# Patient Record
Sex: Male | Born: 1955
Health system: Southern US, Community
[De-identification: ages and names within clinical notes are randomized; demographics above are authoritative.]

## PROBLEM LIST (undated history)

## (undated) DIAGNOSIS — C801 Malignant (primary) neoplasm, unspecified: Secondary | ICD-10-CM

## (undated) DIAGNOSIS — K4090 Unilateral inguinal hernia, without obstruction or gangrene, not specified as recurrent: Secondary | ICD-10-CM

## (undated) DIAGNOSIS — G894 Chronic pain syndrome: Secondary | ICD-10-CM

## (undated) DIAGNOSIS — K279 Peptic ulcer, site unspecified, unspecified as acute or chronic, without hemorrhage or perforation: Secondary | ICD-10-CM

## (undated) DIAGNOSIS — K219 Gastro-esophageal reflux disease without esophagitis: Secondary | ICD-10-CM

## (undated) DIAGNOSIS — M1990 Unspecified osteoarthritis, unspecified site: Secondary | ICD-10-CM

## (undated) DIAGNOSIS — N400 Enlarged prostate without lower urinary tract symptoms: Secondary | ICD-10-CM

## (undated) DIAGNOSIS — K5790 Diverticulosis of intestine, part unspecified, without perforation or abscess without bleeding: Secondary | ICD-10-CM

## (undated) DIAGNOSIS — M4802 Spinal stenosis, cervical region: Secondary | ICD-10-CM

## (undated) DIAGNOSIS — E785 Hyperlipidemia, unspecified: Secondary | ICD-10-CM

## (undated) HISTORY — PX: ELBOW SURGERY: SHX618

## (undated) HISTORY — DX: Unilateral inguinal hernia, without obstruction or gangrene, not specified as recurrent: K40.90

## (undated) HISTORY — DX: Chronic pain syndrome: G89.4

## (undated) HISTORY — DX: Unspecified osteoarthritis, unspecified site: M19.90

## (undated) HISTORY — DX: Peptic ulcer, site unspecified, unspecified as acute or chronic, without hemorrhage or perforation: K27.9

## (undated) HISTORY — PX: FOOT SURGERY: SHX648

## (undated) HISTORY — PX: HERNIA REPAIR: SHX51

## (undated) HISTORY — DX: Diverticulosis of intestine, part unspecified, without perforation or abscess without bleeding: K57.90

## (undated) HISTORY — PX: HAND SURGERY: SHX662

## (undated) HISTORY — DX: Benign prostatic hyperplasia without lower urinary tract symptoms: N40.0

## (undated) HISTORY — PX: SHOULDER ARTHROSCOPY: SHX128

## (undated) HISTORY — DX: Spinal stenosis, cervical region: M48.02

## (undated) HISTORY — PX: KNEE SURGERY: SHX244

## (undated) HISTORY — PX: KNEE ARTHROSCOPY: SUR90

## (undated) HISTORY — PX: ESOPHAGOGASTRODUODENOSCOPY: SHX1529

## (undated) HISTORY — PX: ROTATOR CUFF REPAIR: SHX139

## (undated) HISTORY — PX: VASECTOMY: SHX75

---

## 1998-04-23 ENCOUNTER — Ambulatory Visit (HOSPITAL_BASED_OUTPATIENT_CLINIC_OR_DEPARTMENT_OTHER): Admission: RE | Admit: 1998-04-23 | Discharge: 1998-04-23 | Payer: Self-pay | Admitting: Surgery

## 2001-05-03 ENCOUNTER — Encounter (INDEPENDENT_AMBULATORY_CARE_PROVIDER_SITE_OTHER): Payer: Self-pay | Admitting: Specialist

## 2001-05-03 ENCOUNTER — Other Ambulatory Visit: Admission: RE | Admit: 2001-05-03 | Discharge: 2001-05-03 | Payer: Self-pay | Admitting: Urology

## 2005-12-13 ENCOUNTER — Encounter: Admission: RE | Admit: 2005-12-13 | Discharge: 2005-12-13 | Payer: Self-pay | Admitting: Orthopedic Surgery

## 2008-12-19 ENCOUNTER — Ambulatory Visit: Payer: Self-pay | Admitting: Gastroenterology

## 2009-01-01 ENCOUNTER — Ambulatory Visit: Payer: Self-pay | Admitting: Gastroenterology

## 2012-03-05 ENCOUNTER — Other Ambulatory Visit: Payer: Self-pay | Admitting: Gastroenterology

## 2012-03-05 DIAGNOSIS — R109 Unspecified abdominal pain: Secondary | ICD-10-CM

## 2012-03-05 DIAGNOSIS — R634 Abnormal weight loss: Secondary | ICD-10-CM

## 2012-03-09 ENCOUNTER — Ambulatory Visit
Admission: RE | Admit: 2012-03-09 | Discharge: 2012-03-09 | Disposition: A | Discharge status: Home / Self Care | Payer: BC Managed Care – PPO | Source: Ambulatory Visit | Attending: Gastroenterology | Admitting: Gastroenterology

## 2012-03-09 DIAGNOSIS — R634 Abnormal weight loss: Secondary | ICD-10-CM

## 2012-03-09 DIAGNOSIS — R109 Unspecified abdominal pain: Secondary | ICD-10-CM

## 2012-03-09 MED ORDER — IOHEXOL 300 MG/ML  SOLN
125.0000 mL | Freq: Once | INTRAMUSCULAR | Status: AC | PRN
  Administered 2012-03-09: 125 mL via INTRAVENOUS

## 2013-12-23 ENCOUNTER — Other Ambulatory Visit (HOSPITAL_BASED_OUTPATIENT_CLINIC_OR_DEPARTMENT_OTHER): Payer: Self-pay | Admitting: Family Medicine

## 2013-12-23 DIAGNOSIS — M542 Cervicalgia: Secondary | ICD-10-CM

## 2013-12-27 ENCOUNTER — Ambulatory Visit (HOSPITAL_BASED_OUTPATIENT_CLINIC_OR_DEPARTMENT_OTHER)
Admission: RE | Admit: 2013-12-27 | Discharge: 2013-12-27 | Disposition: A | Discharge status: Home / Self Care | Payer: BC Managed Care – PPO | Source: Ambulatory Visit | Attending: Family Medicine | Admitting: Family Medicine

## 2013-12-27 DIAGNOSIS — M542 Cervicalgia: Secondary | ICD-10-CM

## 2013-12-27 DIAGNOSIS — M47812 Spondylosis without myelopathy or radiculopathy, cervical region: Secondary | ICD-10-CM | POA: Insufficient documentation

## 2013-12-27 DIAGNOSIS — M4802 Spinal stenosis, cervical region: Secondary | ICD-10-CM | POA: Insufficient documentation

## 2014-03-20 ENCOUNTER — Encounter: Payer: Self-pay | Admitting: Gastroenterology

## 2015-09-06 ENCOUNTER — Other Ambulatory Visit (HOSPITAL_BASED_OUTPATIENT_CLINIC_OR_DEPARTMENT_OTHER): Payer: Self-pay | Admitting: Family Medicine

## 2015-09-06 DIAGNOSIS — R0989 Other specified symptoms and signs involving the circulatory and respiratory systems: Secondary | ICD-10-CM

## 2015-09-07 ENCOUNTER — Ambulatory Visit (HOSPITAL_BASED_OUTPATIENT_CLINIC_OR_DEPARTMENT_OTHER)
Admission: RE | Admit: 2015-09-07 | Discharge: 2015-09-07 | Disposition: A | Discharge status: Home / Self Care | Payer: BC Managed Care – PPO | Source: Ambulatory Visit | Attending: Family Medicine | Admitting: Family Medicine

## 2015-09-07 DIAGNOSIS — R0989 Other specified symptoms and signs involving the circulatory and respiratory systems: Secondary | ICD-10-CM

## 2015-09-10 ENCOUNTER — Other Ambulatory Visit (HOSPITAL_BASED_OUTPATIENT_CLINIC_OR_DEPARTMENT_OTHER): Payer: BC Managed Care – PPO

## 2018-05-31 ENCOUNTER — Other Ambulatory Visit: Payer: Self-pay | Admitting: Internal Medicine

## 2018-05-31 DIAGNOSIS — M4802 Spinal stenosis, cervical region: Secondary | ICD-10-CM

## 2018-06-21 ENCOUNTER — Ambulatory Visit
Admission: RE | Admit: 2018-06-21 | Discharge: 2018-06-21 | Disposition: A | Discharge status: Home / Self Care | Payer: BC Managed Care – PPO | Source: Ambulatory Visit | Attending: Internal Medicine | Admitting: Internal Medicine

## 2018-06-21 DIAGNOSIS — M4802 Spinal stenosis, cervical region: Secondary | ICD-10-CM

## 2018-12-20 ENCOUNTER — Encounter: Payer: Self-pay | Admitting: Gastroenterology

## 2019-09-28 ENCOUNTER — Encounter (HOSPITAL_BASED_OUTPATIENT_CLINIC_OR_DEPARTMENT_OTHER): Payer: Self-pay | Admitting: Orthopedic Surgery

## 2019-09-28 ENCOUNTER — Other Ambulatory Visit: Payer: Self-pay | Admitting: Orthopedic Surgery

## 2019-09-28 ENCOUNTER — Other Ambulatory Visit: Payer: Self-pay

## 2019-09-29 ENCOUNTER — Other Ambulatory Visit (HOSPITAL_COMMUNITY): Admission: RE | Admit: 2019-09-29 | Payer: BC Managed Care – PPO | Source: Ambulatory Visit

## 2019-09-30 ENCOUNTER — Other Ambulatory Visit (HOSPITAL_COMMUNITY)
Admission: RE | Admit: 2019-09-30 | Discharge: 2019-09-30 | Disposition: A | Discharge status: Home / Self Care | Payer: BC Managed Care – PPO | Source: Ambulatory Visit | Attending: Orthopedic Surgery | Admitting: Orthopedic Surgery

## 2019-09-30 DIAGNOSIS — Z20822 Contact with and (suspected) exposure to covid-19: Secondary | ICD-10-CM | POA: Diagnosis not present

## 2019-09-30 DIAGNOSIS — Z01812 Encounter for preprocedural laboratory examination: Secondary | ICD-10-CM | POA: Diagnosis present

## 2019-09-30 LAB — SARS CORONAVIRUS 2 (TAT 6-24 HRS): SARS Coronavirus 2: NEGATIVE

## 2019-10-03 ENCOUNTER — Encounter (HOSPITAL_BASED_OUTPATIENT_CLINIC_OR_DEPARTMENT_OTHER): Payer: Self-pay | Admitting: Orthopedic Surgery

## 2019-10-03 ENCOUNTER — Ambulatory Visit (HOSPITAL_BASED_OUTPATIENT_CLINIC_OR_DEPARTMENT_OTHER): Payer: BC Managed Care – PPO | Admitting: Anesthesiology

## 2019-10-03 ENCOUNTER — Ambulatory Visit (HOSPITAL_BASED_OUTPATIENT_CLINIC_OR_DEPARTMENT_OTHER)
Admission: RE | Admit: 2019-10-03 | Discharge: 2019-10-03 | Disposition: A | Discharge status: Home / Self Care | Payer: BC Managed Care – PPO | Attending: Orthopedic Surgery | Admitting: Orthopedic Surgery

## 2019-10-03 ENCOUNTER — Encounter (HOSPITAL_BASED_OUTPATIENT_CLINIC_OR_DEPARTMENT_OTHER)
Admission: RE | Disposition: A | Discharge status: Home / Self Care | Payer: Self-pay | Source: Home / Self Care | Attending: Orthopedic Surgery

## 2019-10-03 ENCOUNTER — Other Ambulatory Visit: Payer: Self-pay

## 2019-10-03 DIAGNOSIS — Z79899 Other long term (current) drug therapy: Secondary | ICD-10-CM | POA: Insufficient documentation

## 2019-10-03 DIAGNOSIS — E669 Obesity, unspecified: Secondary | ICD-10-CM | POA: Insufficient documentation

## 2019-10-03 DIAGNOSIS — M67441 Ganglion, right hand: Secondary | ICD-10-CM | POA: Insufficient documentation

## 2019-10-03 DIAGNOSIS — E785 Hyperlipidemia, unspecified: Secondary | ICD-10-CM | POA: Insufficient documentation

## 2019-10-03 DIAGNOSIS — M19041 Primary osteoarthritis, right hand: Secondary | ICD-10-CM | POA: Diagnosis not present

## 2019-10-03 DIAGNOSIS — Z6831 Body mass index (BMI) 31.0-31.9, adult: Secondary | ICD-10-CM | POA: Insufficient documentation

## 2019-10-03 HISTORY — PX: CYST EXCISION: SHX5701

## 2019-10-03 HISTORY — DX: Gastro-esophageal reflux disease without esophagitis: K21.9

## 2019-10-03 HISTORY — DX: Hyperlipidemia, unspecified: E78.5

## 2019-10-03 SURGERY — CYST REMOVAL
Anesthesia: Monitor Anesthesia Care | Site: Thumb | Laterality: Right

## 2019-10-03 MED ORDER — CHLORHEXIDINE GLUCONATE 4 % EX LIQD: 60.0000 mL | Freq: Once | CUTANEOUS | Status: DC

## 2019-10-03 MED ORDER — ONDANSETRON HCL 4 MG/2ML IJ SOLN: 4.0000 mg | Freq: Once | INTRAMUSCULAR | Status: DC | PRN

## 2019-10-03 MED ORDER — LIDOCAINE HCL (CARDIAC) PF 100 MG/5ML IV SOSY
PREFILLED_SYRINGE | INTRAVENOUS | Status: DC | PRN
  Administered 2019-10-03: 60 mg via INTRAVENOUS

## 2019-10-03 MED ORDER — OXYCODONE HCL 5 MG PO TABS
ORAL_TABLET | ORAL | Status: AC
  Filled 2019-10-03: qty 1

## 2019-10-03 MED ORDER — FENTANYL CITRATE (PF) 100 MCG/2ML IJ SOLN: 50.0000 ug | INTRAMUSCULAR | Status: DC | PRN

## 2019-10-03 MED ORDER — LIDOCAINE 2% (20 MG/ML) 5 ML SYRINGE
INTRAMUSCULAR | Status: AC
  Filled 2019-10-03: qty 5

## 2019-10-03 MED ORDER — ONDANSETRON HCL 4 MG/2ML IJ SOLN
INTRAMUSCULAR | Status: DC | PRN
  Administered 2019-10-03: 4 mg via INTRAVENOUS

## 2019-10-03 MED ORDER — MIDAZOLAM HCL 2 MG/2ML IJ SOLN: 1.0000 mg | INTRAMUSCULAR | Status: DC | PRN

## 2019-10-03 MED ORDER — OXYCODONE HCL 5 MG/5ML PO SOLN: 5.0000 mg | Freq: Once | ORAL | Status: AC | PRN

## 2019-10-03 MED ORDER — BUPIVACAINE HCL (PF) 0.25 % IJ SOLN
INTRAMUSCULAR | Status: DC | PRN
  Administered 2019-10-03: 9 mL

## 2019-10-03 MED ORDER — HYDROCODONE-ACETAMINOPHEN 5-325 MG PO TABS: ORAL_TABLET | ORAL | 0 refills | Status: DC

## 2019-10-03 MED ORDER — OXYCODONE HCL 5 MG PO TABS
5.0000 mg | ORAL_TABLET | Freq: Once | ORAL | Status: AC | PRN
  Administered 2019-10-03: 5 mg via ORAL

## 2019-10-03 MED ORDER — FENTANYL CITRATE (PF) 100 MCG/2ML IJ SOLN
INTRAMUSCULAR | Status: DC | PRN
  Administered 2019-10-03: 100 ug via INTRAVENOUS

## 2019-10-03 MED ORDER — LACTATED RINGERS IV SOLN: INTRAVENOUS | Status: DC

## 2019-10-03 MED ORDER — PROPOFOL 500 MG/50ML IV EMUL
INTRAVENOUS | Status: DC | PRN
  Administered 2019-10-03: 25 ug/kg/min via INTRAVENOUS

## 2019-10-03 MED ORDER — MIDAZOLAM HCL 5 MG/5ML IJ SOLN
INTRAMUSCULAR | Status: DC | PRN
  Administered 2019-10-03: 2 mg via INTRAVENOUS

## 2019-10-03 MED ORDER — VANCOMYCIN HCL IN DEXTROSE 1-5 GM/200ML-% IV SOLN
1000.0000 mg | INTRAVENOUS | Status: AC
  Administered 2019-10-03: 1000 mg via INTRAVENOUS

## 2019-10-03 MED ORDER — FENTANYL CITRATE (PF) 100 MCG/2ML IJ SOLN
INTRAMUSCULAR | Status: AC
  Filled 2019-10-03: qty 2

## 2019-10-03 MED ORDER — ONDANSETRON HCL 4 MG/2ML IJ SOLN
INTRAMUSCULAR | Status: AC
  Filled 2019-10-03: qty 2

## 2019-10-03 MED ORDER — VANCOMYCIN HCL IN DEXTROSE 1-5 GM/200ML-% IV SOLN
INTRAVENOUS | Status: AC
  Filled 2019-10-03: qty 200

## 2019-10-03 MED ORDER — FENTANYL CITRATE (PF) 100 MCG/2ML IJ SOLN: 25.0000 ug | INTRAMUSCULAR | Status: DC | PRN

## 2019-10-03 MED ORDER — FENTANYL CITRATE (PF) 100 MCG/2ML IJ SOLN
25.0000 ug | INTRAMUSCULAR | Status: DC | PRN
  Administered 2019-10-03: 50 ug via INTRAVENOUS

## 2019-10-03 MED ORDER — MIDAZOLAM HCL 2 MG/2ML IJ SOLN
INTRAMUSCULAR | Status: AC
  Filled 2019-10-03: qty 2

## 2019-10-03 MED ORDER — PROPOFOL 10 MG/ML IV BOLUS
INTRAVENOUS | Status: AC
  Filled 2019-10-03: qty 20

## 2019-10-03 MED ORDER — VANCOMYCIN HCL 1000 MG IV SOLR
INTRAVENOUS | Status: DC | PRN
  Administered 2019-10-03: 1000 mg via INTRAVENOUS

## 2019-10-03 SURGICAL SUPPLY — 52 items
BENZOIN TINCTURE PRP APPL 2/3 (GAUZE/BANDAGES/DRESSINGS) IMPLANT
BLADE MINI RND TIP GREEN BEAV (BLADE) ×3 IMPLANT
BLADE SURG 15 STRL LF DISP TIS (BLADE) ×2 IMPLANT
BLADE SURG 15 STRL SS (BLADE) ×6
BNDG COHESIVE 1X5 TAN STRL LF (GAUZE/BANDAGES/DRESSINGS) ×3 IMPLANT
BNDG COHESIVE 2X5 TAN STRL LF (GAUZE/BANDAGES/DRESSINGS) IMPLANT
BNDG CONFORM 2 STRL LF (GAUZE/BANDAGES/DRESSINGS) IMPLANT
BNDG ELASTIC 2X5.8 VLCR STR LF (GAUZE/BANDAGES/DRESSINGS) IMPLANT
BNDG ELASTIC 3X5.8 VLCR STR LF (GAUZE/BANDAGES/DRESSINGS) IMPLANT
BNDG ESMARK 4X9 LF (GAUZE/BANDAGES/DRESSINGS) ×3 IMPLANT
BNDG GAUZE 1X2.1 STRL (MISCELLANEOUS) IMPLANT
BNDG GAUZE ELAST 4 BULKY (GAUZE/BANDAGES/DRESSINGS) IMPLANT
BNDG PLASTER X FAST 3X3 WHT LF (CAST SUPPLIES) IMPLANT
CHLORAPREP W/TINT 26 (MISCELLANEOUS) ×3 IMPLANT
CLOSURE WOUND 1/2 X4 (GAUZE/BANDAGES/DRESSINGS)
CORD BIPOLAR FORCEPS 12FT (ELECTRODE) ×3 IMPLANT
COVER BACK TABLE 60X90IN (DRAPES) ×3 IMPLANT
COVER MAYO STAND STRL (DRAPES) ×3 IMPLANT
COVER WAND RF STERILE (DRAPES) IMPLANT
CUFF TOURN SGL QUICK 18X4 (TOURNIQUET CUFF) ×3 IMPLANT
DRAPE EXTREMITY T 121X128X90 (DISPOSABLE) ×3 IMPLANT
DRAPE SURG 17X23 STRL (DRAPES) ×3 IMPLANT
GAUZE SPONGE 4X4 12PLY STRL (GAUZE/BANDAGES/DRESSINGS) ×3 IMPLANT
GAUZE XEROFORM 1X8 LF (GAUZE/BANDAGES/DRESSINGS) ×3 IMPLANT
GLOVE BIO SURGEON STRL SZ7 (GLOVE) ×3 IMPLANT
GLOVE BIO SURGEON STRL SZ7.5 (GLOVE) ×3 IMPLANT
GLOVE BIOGEL PI IND STRL 7.5 (GLOVE) ×1 IMPLANT
GLOVE BIOGEL PI IND STRL 8 (GLOVE) ×1 IMPLANT
GLOVE BIOGEL PI INDICATOR 7.5 (GLOVE) ×2
GLOVE BIOGEL PI INDICATOR 8 (GLOVE) ×2
GOWN STRL REUS W/ TWL LRG LVL3 (GOWN DISPOSABLE) ×1 IMPLANT
GOWN STRL REUS W/ TWL XL LVL3 (GOWN DISPOSABLE) ×1 IMPLANT
GOWN STRL REUS W/TWL LRG LVL3 (GOWN DISPOSABLE) ×3
GOWN STRL REUS W/TWL XL LVL3 (GOWN DISPOSABLE) ×3
NEEDLE HYPO 25X1 1.5 SAFETY (NEEDLE) ×3 IMPLANT
NS IRRIG 1000ML POUR BTL (IV SOLUTION) ×3 IMPLANT
PACK BASIN DAY SURGERY FS (CUSTOM PROCEDURE TRAY) ×3 IMPLANT
PAD CAST 3X4 CTTN HI CHSV (CAST SUPPLIES) IMPLANT
PAD CAST 4YDX4 CTTN HI CHSV (CAST SUPPLIES) IMPLANT
PADDING CAST ABS 4INX4YD NS (CAST SUPPLIES)
PADDING CAST ABS COTTON 4X4 ST (CAST SUPPLIES) IMPLANT
PADDING CAST COTTON 3X4 STRL (CAST SUPPLIES)
PADDING CAST COTTON 4X4 STRL (CAST SUPPLIES)
SPLINT FINGER 3.25 BULB 911905 (SOFTGOODS) ×3 IMPLANT
STOCKINETTE 4X48 STRL (DRAPES) ×3 IMPLANT
STRIP CLOSURE SKIN 1/2X4 (GAUZE/BANDAGES/DRESSINGS) IMPLANT
SUT ETHILON 3 0 PS 1 (SUTURE) IMPLANT
SUT ETHILON 4 0 PS 2 18 (SUTURE) ×3 IMPLANT
SYR BULB 3OZ (MISCELLANEOUS) ×3 IMPLANT
SYR CONTROL 10ML LL (SYRINGE) ×3 IMPLANT
TOWEL GREEN STERILE FF (TOWEL DISPOSABLE) ×6 IMPLANT
UNDERPAD 30X36 HEAVY ABSORB (UNDERPADS AND DIAPERS) ×3 IMPLANT

## 2019-10-03 NOTE — Anesthesia Postprocedure Evaluation (Signed)
Anesthesia Post Note  Patient: Stephen Carey  Procedure(s) Performed: EXCISION MUCOID CYST WITH INTERPHALAGEAL JOINT ARTHROTOMY RIGHT THUMB (Right Thumb)     Patient location during evaluation: PACU Anesthesia Type: MAC Level of consciousness: awake and alert Pain management: pain level controlled Vital Signs Assessment: post-procedure vital signs reviewed and stable Respiratory status: spontaneous breathing, nonlabored ventilation and respiratory function stable Cardiovascular status: stable and blood pressure returned to baseline Anesthetic complications: no    Last Vitals:  Vitals:   10/03/19 1418 10/03/19 1430  BP: 139/83 (!) 160/86  Pulse: 63 61  Resp: 15 16  Temp:    SpO2: 100% 100%    Last Pain:  Vitals:   10/03/19 1410  TempSrc:   PainSc: [place] [name]

## 2019-10-03 NOTE — Transfer of Care (Signed)
Immediate Anesthesia Transfer of Care Note  Patient: Stephen Carey  Procedure(s) Performed: EXCISION MUCOID CYST WITH INTERPHALAGEAL JOINT ARTHROTOMY RIGHT THUMB (Right Thumb)  Patient Location: PACU  Anesthesia Type:MAC and Bier block  Level of Consciousness: awake and alert   Airway & Oxygen Therapy: Patient Spontanous Breathing and Patient connected to face mask oxygen  Post-op Assessment: Report given to RN and Post -op Vital signs reviewed and stable  Post vital signs: Reviewed and stable  Last Vitals:  Vitals Value Taken Time  BP    Temp    Pulse 67 10/03/19 1410  Resp 18 10/03/19 1411  SpO2 100 % 10/03/19 1410  Vitals shown include unvalidated device data.  Last Pain:  Vitals:   10/03/19 1213  TempSrc: Tympanic  PainSc: 0-No pain         Complications: No apparent anesthesia complications

## 2019-10-03 NOTE — Op Note (Signed)
NAME: Stephen Carey MEDICAL RECORD NO: [id number] DATE OF BIRTH: Jan 05, 1956 FACILITY: [facility] LOCATION: [facility] PHYSICIAN: Tennis Must, MD   OPERATIVE REPORT   DATE OF PROCEDURE: 10/03/19    PREOPERATIVE DIAGNOSIS:   Right thumb mucoid cyst and IP joint arthritis   POSTOPERATIVE DIAGNOSIS:   Right thumb mucoid cyst and IP joint arthritis   PROCEDURE:   1.  Right thumb excision mucoid cyst 2.  Right thumb removal of bony osteophytes from proximal phalanx and debridement of IP joint   SURGEON:  Leanora Cover, M.D.   ASSISTANT: none   ANESTHESIA:  Bier block with sedation   INTRAVENOUS FLUIDS:  Per anesthesia flow sheet.   ESTIMATED BLOOD LOSS:  Minimal.   COMPLICATIONS:  None.   SPECIMENS:   Mucoid cyst to pathology   TOURNIQUET TIME:    Total Tourniquet Time Documented: Forearm (Right) - 33 minutes Total: Forearm (Right) - 33 minutes    DISPOSITION:  Stable to PACU.   INDICATIONS: 64 year old male with mass on right thumb over the past 6 months.  Is bothersome to him.  Wishes to have it removed and the IP joint debrided to try to prevent recurrence. Risks, benefits and alternatives of surgery were discussed including the risks of blood loss, infection, damage to nerves, vessels, tendons, ligaments, bone for surgery, need for additional surgery, complications with wound healing, continued pain, stiffness, recurrence.  He voiced understanding of these risks and elected to proceed.  OPERATIVE COURSE:  After being identified preoperatively by myself,  the patient and I agreed on the procedure and site of the procedure.  The surgical site was marked.  Surgical consent had been signed. He was given IV antibiotics as preoperative antibiotic prophylaxis. He was transferred to the operating room and placed on the operating table in supine position with the Right upper extremity on an arm board.  Bier block anesthesia was induced by the anesthesiologist.  Right  upper extremity was prepped and draped in normal sterile orthopedic fashion.  A surgical pause was performed between the surgeons, anesthesia, and operating room staff and all were in agreement as to the patient, procedure, and site of procedure.  Tourniquet at the proximal aspect of the forearm had been inflated for the Bier block.    A hockey-stick shaped incision was made at the IP joint of the right thumb.  This is carried in subcutaneous tissues by spreading technique.  The mass was easily identified.  It was carefully freed up from surrounding tissues.  There was a stalk coming from the IP joint underneath the extensor tendon.  There was more of the cystic mass underneath the extensor tendon.  There was what appeared to be a cutaneous nerve branch coursing through the mass.  Attempts to preserve this were not successful.  The mass was removed and sent to pathology for examination.  The IP joint was entered underneath the extensor tendon.  There was a large osteophyte on the dorsum of the proximal phalanx.  This was removed with the rongeurs.  The synovium and joint were debrided with the rongeurs.  The joint and wound were then copiously irrigated with sterile saline.  The wound was closed with 4-0 nylon in a horizontal mattress fashion.  A digital block was performed with quarter percent plain Marcaine to aid in postoperative analgesia.  Wound was dressed with sterile Xeroform 4 x 4 and wrapped with a Coban dressing lightly.  An AlumaFoam splint was placed and wrapped lightly  with Coban dressing.  The tourniquet was deflated at 33 minutes.  Fingertips were pink with brisk capillary refill after deflation of tourniquet.  The operative  drapes were broken down.  The patient was awoken from anesthesia safely.  He was transferred back to the stretcher and taken to PACU in stable condition.  I will see him back in the office in 1 week for postoperative followup.  I will give him a prescription for Norco 5/325 1-2  tabs PO q6 hours prn pain, dispense # 20.   Leanora Cover, MD Electronically signed, 10/03/19

## 2019-10-03 NOTE — H&P (Signed)
Stephen Carey is an 64 y.o. male.   Chief Complaint: right thumb cyst HPI: 64 yo male with mass right thumb x 6 months.  It is bothersome to him and he wishes to have it removed.  Allergies:  Allergies  Allergen Reactions  . Doxycycline     REACTION: severe diarrhea  . Penicillins     REACTION: as child  . Sulfonamide Derivatives     REACTION: rash, nausea    Past Medical History:  Diagnosis Date  . GERD (gastroesophageal reflux disease)   . Hyperlipidemia     Past Surgical History:  Procedure Laterality Date  . ELBOW SURGERY Bilateral   . FOOT SURGERY Right   . HAND SURGERY Left   . HERNIA REPAIR Bilateral   . KNEE ARTHROSCOPY Right   . SHOULDER ARTHROSCOPY Left     Family History: History reviewed. No pertinent family history.  Social History:   reports that he has never smoked. He has never used smokeless tobacco. He reports that he does not drink alcohol or use drugs.  Medications: Medications Prior to Admission  Medication Sig Dispense Refill  . atorvastatin (LIPITOR) 40 MG tablet Take 40 mg by mouth daily.    . Fluocinolone Acetonide (DERMOTIC) 0.01 % OIL Place in ear(s).    . loratadine (CLARITIN) 10 MG tablet Take 10 mg by mouth daily.    . traMADol (ULTRAM) 50 MG tablet Take by mouth every 6 (six) hours as needed.      No results found for this or any previous visit (from the past 48 hour(s)).  No results found.   A comprehensive review of systems was negative.  Blood pressure 131/78, pulse 69, temperature (!) 97.4 F (36.3 C), temperature source Tympanic, resp. rate 18, height 6' (1.829 m), weight 106.6 kg, SpO2 100 %.  General appearance: alert, cooperative and appears stated age Head: Normocephalic, without obvious abnormality, atraumatic Neck: supple, symmetrical, trachea midline Cardio: regular rate and rhythm Resp: clear to auscultation bilaterally Extremities: Intact sensation and capillary refill all digits.  +epl/fpl/io.  No wounds.   Pulses: 2+ and symmetric Skin: Skin color, texture, turgor normal. No rashes or lesions Neurologic: Grossly normal Incision/Wound: none  Assessment/Plan Right thumb mucoid cyst and dip joint arthritis.  Non operative and operative treatment options have been discussed with the patient and patient wishes to proceed with operative treatment. Risks, benefits, and alternatives of surgery have been discussed and the patient agrees with the plan of care.   Leanora Cover 10/03/2019, 12:35 PM

## 2019-10-03 NOTE — Anesthesia Procedure Notes (Signed)
Anesthesia Regional Block: Bier block (IV Regional)   Pre-Anesthetic Checklist: ,, timeout performed, Correct Patient, Correct Site, Correct Laterality, Correct Procedure,, site marked, surgical consent,, at surgeon's request  Laterality: Right     Needles:  Injection technique: Single-shot  Needle Type: Other      Needle Gauge: 20     Additional Needles:   Procedures:,,,,, intact distal pulses, Esmarch exsanguination, single tourniquet utilized,  Narrative:  Start time: 10/03/2019 1:29 PM End time: 10/03/2019 1:30 PM Injection made incrementally with aspirations every 33 mL.  Performed by: Personally

## 2019-10-03 NOTE — Anesthesia Procedure Notes (Signed)
Performed by: Chou Busler W, CRNA       

## 2019-10-03 NOTE — Discharge Instructions (Addendum)
[facility] Instructions Hand Surgery  Wound Care: Keep your hand elevated above the level of your heart.  Do not allow it to dangle by your side.  Keep the dressing dry and do not remove it unless your doctor advises you to do so.  He will usually change it at the time of your post-op visit.  Moving your fingers is advised to stimulate circulation but will depend on the site of your surgery.  If you have a splint applied, your doctor will advise you regarding movement.  Activity: Do not drive or operate machinery today.  Rest today and then you may return to your normal activity and work as indicated by your physician.  Diet:  Drink liquids today or eat a light diet.  You may resume a regular diet tomorrow.    General expectations: Pain for two to three days. *You had 5 mg of Oxycodone at 2:45 PM Fingers may become slightly swollen.  Call your doctor if any of the following occur: Severe pain not relieved by pain medication. Elevated temperature. Dressing soaked with blood. Inability to move fingers. White or bluish color to fingers.   Post Anesthesia Home Care Instructions  Activity: Get plenty of rest for the remainder of the day. A responsible individual must stay with you for 24 hours following the procedure.  For the next 24 hours, DO NOT: -Drive a car -Paediatric nurse -Drink alcoholic beverages -Take any medication unless instructed by your physician -Make any legal decisions or sign important papers.  Meals: Start with liquid foods such as gelatin or soup. Progress to regular foods as tolerated. Avoid greasy, spicy, heavy foods. If nausea and/or vomiting occur, drink only clear liquids until the nausea and/or vomiting subsides. Call your physician if vomiting continues.  Special Instructions/Symptoms: Your throat may feel dry or sore from the anesthesia or the breathing tube placed in your throat during surgery. If this causes discomfort, gargle with warm salt water.  The discomfort should disappear within 24 hours.  If you had a scopolamine patch placed behind your ear for the management of post- operative nausea and/or vomiting:  1. The medication in the patch is effective for 72 hours, after which it should be removed.  Wrap patch in a tissue and discard in the trash. Wash hands thoroughly with soap and water. 2. You may remove the patch earlier than 72 hours if you experience unpleasant side effects which may include dry mouth, dizziness or visual disturbances. 3. Avoid touching the patch. Wash your hands with soap and water after contact with the patch.

## 2019-10-03 NOTE — Anesthesia Preprocedure Evaluation (Addendum)
Anesthesia Evaluation  Patient identified by MRN, date of birth, ID band Patient awake    Reviewed: Allergy & Precautions, Patient's Chart, lab work & pertinent test results  History of Anesthesia Complications Negative for: history of anesthetic complications  Airway Mallampati: II  TM Distance: >3 FB     Dental   Pulmonary neg pulmonary ROS,    breath sounds clear to auscultation       Cardiovascular negative cardio ROS   Rhythm:Regular Rate:Normal     Neuro/Psych negative neurological ROS  negative psych ROS   GI/Hepatic Neg liver ROS, GERD  ,  Endo/Other   Obesity   Renal/GU negative Renal ROS     Musculoskeletal negative musculoskeletal ROS (+)   Abdominal   Peds  Hematology negative hematology ROS (+)   Anesthesia Other Findings Covid neg 09/30/19   Reproductive/Obstetrics                            Anesthesia Physical Anesthesia Plan  ASA: III  Anesthesia Plan: MAC and Bier Block and Bier Block-LIDOCAINE ONLY   Post-op Pain Management:    Induction: Intravenous  PONV Risk Score and Plan: 1 and Propofol infusion and Treatment may vary due to age or medical condition  Airway Management Planned: Simple Face Mask and Nasal Cannula  Additional Equipment: None  Intra-op Plan:   Post-operative Plan:   Informed Consent: I have reviewed the patients History and Physical, chart, labs and discussed the procedure including the risks, benefits and alternatives for the proposed anesthesia with the patient or authorized representative who has indicated his/her understanding and acceptance.     Dental advisory given  Plan Discussed with: CRNA and Anesthesiologist  Anesthesia Plan Comments:        Anesthesia Quick Evaluation

## 2019-10-04 ENCOUNTER — Encounter: Payer: Self-pay | Admitting: *Deleted

## 2019-10-04 LAB — SURGICAL PATHOLOGY

## 2020-09-21 ENCOUNTER — Other Ambulatory Visit: Payer: Self-pay | Admitting: Internal Medicine

## 2020-09-21 DIAGNOSIS — R29898 Other symptoms and signs involving the musculoskeletal system: Secondary | ICD-10-CM

## 2020-10-01 DIAGNOSIS — Z961 Presence of intraocular lens: Secondary | ICD-10-CM | POA: Diagnosis not present

## 2020-10-01 DIAGNOSIS — H25042 Posterior subcapsular polar age-related cataract, left eye: Secondary | ICD-10-CM | POA: Diagnosis not present

## 2020-10-01 DIAGNOSIS — H524 Presbyopia: Secondary | ICD-10-CM | POA: Diagnosis not present

## 2020-10-01 DIAGNOSIS — H2512 Age-related nuclear cataract, left eye: Secondary | ICD-10-CM | POA: Diagnosis not present

## 2020-10-08 ENCOUNTER — Other Ambulatory Visit: Payer: BC Managed Care – PPO

## 2020-10-08 ENCOUNTER — Other Ambulatory Visit: Payer: Self-pay

## 2020-10-08 ENCOUNTER — Ambulatory Visit
Admission: RE | Admit: 2020-10-08 | Discharge: 2020-10-08 | Disposition: A | Discharge status: Home / Self Care | Payer: Medicare HMO | Source: Ambulatory Visit | Attending: Internal Medicine | Admitting: Internal Medicine

## 2020-10-08 DIAGNOSIS — Z5181 Encounter for therapeutic drug level monitoring: Secondary | ICD-10-CM | POA: Diagnosis not present

## 2020-10-08 DIAGNOSIS — M48061 Spinal stenosis, lumbar region without neurogenic claudication: Secondary | ICD-10-CM | POA: Diagnosis not present

## 2020-10-08 DIAGNOSIS — R29898 Other symptoms and signs involving the musculoskeletal system: Secondary | ICD-10-CM

## 2020-10-08 DIAGNOSIS — E782 Mixed hyperlipidemia: Secondary | ICD-10-CM | POA: Diagnosis not present

## 2020-10-26 HISTORY — PX: CATARACT EXTRACTION: SUR2

## 2020-10-30 DIAGNOSIS — H2512 Age-related nuclear cataract, left eye: Secondary | ICD-10-CM | POA: Diagnosis not present

## 2020-10-30 DIAGNOSIS — H25012 Cortical age-related cataract, left eye: Secondary | ICD-10-CM | POA: Diagnosis not present

## 2020-10-30 DIAGNOSIS — H25042 Posterior subcapsular polar age-related cataract, left eye: Secondary | ICD-10-CM | POA: Diagnosis not present

## 2020-10-30 DIAGNOSIS — H25812 Combined forms of age-related cataract, left eye: Secondary | ICD-10-CM | POA: Diagnosis not present

## 2020-12-06 DIAGNOSIS — R1032 Left lower quadrant pain: Secondary | ICD-10-CM | POA: Diagnosis not present

## 2020-12-06 DIAGNOSIS — R300 Dysuria: Secondary | ICD-10-CM | POA: Diagnosis not present

## 2020-12-13 ENCOUNTER — Encounter: Payer: Self-pay | Admitting: *Deleted

## 2020-12-17 ENCOUNTER — Other Ambulatory Visit: Payer: Self-pay

## 2020-12-17 ENCOUNTER — Ambulatory Visit: Payer: Medicare HMO | Admitting: Neurology

## 2020-12-17 ENCOUNTER — Encounter: Payer: Self-pay | Admitting: *Deleted

## 2020-12-17 VITALS — BP 134/76 | HR 67 | Ht 72.0 in | Wt 226.0 lb

## 2020-12-17 DIAGNOSIS — M25552 Pain in left hip: Secondary | ICD-10-CM | POA: Insufficient documentation

## 2020-12-17 DIAGNOSIS — R200 Anesthesia of skin: Secondary | ICD-10-CM

## 2020-12-17 DIAGNOSIS — R202 Paresthesia of skin: Secondary | ICD-10-CM | POA: Diagnosis not present

## 2020-12-17 DIAGNOSIS — G8929 Other chronic pain: Secondary | ICD-10-CM | POA: Insufficient documentation

## 2020-12-17 DIAGNOSIS — M545 Low back pain, unspecified: Secondary | ICD-10-CM | POA: Diagnosis not present

## 2020-12-17 MED ORDER — GABAPENTIN 100 MG PO CAPS: 100.0000 mg | ORAL_CAPSULE | Freq: Three times a day (TID) | ORAL | 5 refills | Status: DC | PRN

## 2020-12-17 NOTE — Patient Instructions (Signed)
[facility]    Address: [address], [place], [place] [address]  Phone: [phone]   

## 2020-12-17 NOTE — Progress Notes (Signed)
Chief Complaint  Patient presents with  . Left leg weakness, numbness    Rm 16 New Pt  wife- Mardene Celeste       ASSESSMENT AND PLAN  ABEM SHADDIX is a 65 y.o. male   Left groin area pain, left anterior thigh paresthesia  Differentiation diagnosis include left hip pathology, lateral femoral cutaneous nerve neuropathy  He has well-preserved bilateral knee reflex, MRI of lumbar spine only showed mild degenerative changes, no evidence of significant foraminal narrowing  EMG nerve conduction study  X-ray of left hip  Gabapentin 100 mg 3 times daily as needed  He has multiple enlarged painful joints, laboratory evaluations, including inflammatory markers to rule out inflammatory arthritis  DIAGNOSTIC DATA (LABS, IMAGING, TESTING) - I reviewed patient records, labs, notes, testing and imaging myself where available.  MRI of lumbar spine in March 2022: 1. Diffuse lumbar facet osteoarthritis greatest at L4-5 where there is marrow edema. 2. Moderate foraminal impingement on the right at L4-5. 3. Diffusely patent spinal canal.  MRI of cervical spine November 2019, Moderate to moderately severe bilateral foraminal narrowing due to uncovertebral spurring and right worse than left facet arthropathy at C3-4 appears worse on the prior examination. The cervical spine is otherwise stable in appearance.  Spondylosis appears worst overall at C6-7 where the cord is flattened and there is severe bilateral foraminal narrowing due to a disc osteophyte complex and uncovertebral disease.  Mild flattening of the ventral cord and moderate to moderately severe left foraminal narrowing C5-6.  Multilevel facet degenerative disease appears worst on the right at C4-5 and C7-T1.  Laboratory evaluation in January 2022, LDL 133, cholesterol 200, normal CMP, creatinine of 1.15,  HISTORICAL  Stephen Carey is a 65 year old male, seen in request by his primary care doctor Lavone Orn, for  evaluation of left groin area pain, numbness, he is accompanied by his wife at today's visit on Dec 17, 2020  I reviewed and summarized the referring note. PMHx. HLD History of Gastric ulcer Multiple joint surgery for sports related injury,  He still plays golf  3-4 times each week, described at the beginning, when he first stepped out of the car, he has low back stiffness, but after he walks for a while, he will loosen up, feeling much better, he denies gait abnormality, denies bowel and bladder incontinence, denies bilateral upper or lower extremity paresthesia,  Since 2019, he noticed intermittent numbness involving left anterior and lateral thigh, over 3 years, he denies significant change, but he noticed that his left quadriceps muscle a bit softer than the right side, while he played golf, he tends to pivot his body on his right leg, swing with his right arm,  He also complains of multiple joints pain, including enlarged right third finger mid phalangeal joints, painful upon palpation, he works as a Art gallery manager, denies difficulty 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,  He did have some neck pain,  We personally reviewed MRI of cervical spine in 2019, multilevel degenerative changes, no evidence of significant spinal canal stenosis, there is severe bilateral foraminal narrowing at C6 and 7 level  MRI of lumbar spine in March 2022: Diffuse lumbar facet osteoarthritis, greatest at L4-5, there is marrow edema, moderate foraminal narrowing, worsening on the right side   PHYSICAL EXAM:   Vitals:   12/17/20 0957  BP: 134/76  Pulse: 67  Weight: 226 lb (102.5 kg)  Height: 6' (1.829 m)   Not recorded     Body mass index is 30.65 kg/m.  PHYSICAL EXAMNIATION:  Gen: NAD, conversant, well nourised, well groomed                     Cardiovascular: Regular rate rhythm, no peripheral edema, warm, nontender. Eyes: Conjunctivae clear without exudates or hemorrhage Neck: Supple, no carotid bruits. Pulmonary: Clear  to auscultation bilaterally   NEUROLOGICAL EXAM:  MENTAL STATUS: Speech:    Speech is normal; fluent and spontaneous with normal comprehension.  Cognition:     Orientation to time, place and person     Normal recent and remote memory     Normal Attention span and concentration     Normal Language, naming, repeating,spontaneous speech     Fund of knowledge   CRANIAL NERVES: CN II: Visual fields are full to confrontation. Pupils are round equal and briskly reactive to light. CN III, IV, VI: extraocular movement are normal. No ptosis. CN V: Facial sensation is intact to light touch CN VII: Face is symmetric with normal eye closure  CN VIII: Hearing is normal to causal conversation. CN IX, X: Phonation is normal. CN XI: Head turning and shoulder shrug are intact  MOTOR: There is no pronator drift of out-stretched arms. Muscle bulk and tone are normal. Muscle strength is normal.  REFLEXES: Reflexes are 2+ and symmetric at the biceps, triceps, knees, and ankles. Plantar responses are flexor.  SENSORY: Intact to light touch, pinprick and vibratory sensation are intact in fingers and toes.  COORDINATION: There is no trunk or limb dysmetria noted.  GAIT/STANCE: Posture is normal. Gait is steady with normal steps, base, arm swing, and turning. Heel and toe walking are normal. Tandem gait is normal.  Romberg is absent.  REVIEW OF SYSTEMS:  Full 14 system review of systems performed and notable only for as above All other review of systems were negative.   ALLERGIES: Allergies  Allergen Reactions  . Sulfa Antibiotics Hives    rash  . Atorvastatin     myalgias  . Doxycycline     REACTION: severe diarrhea  . Penicillins     REACTION: as child  . Percocet [Oxycodone-Acetaminophen]     GI upset  . Sulfonamide Derivatives     REACTION: rash, nausea    HOME MEDICATIONS: Current Outpatient Medications  Medication Sig Dispense Refill  . atorvastatin (LIPITOR) 20 MG tablet  Take 20 mg by mouth daily.    . fluticasone (FLONASE) 50 MCG/ACT nasal spray 1 spray in each nostril    . Omeprazole 20 MG TBEC 1 tablet 30 minutes before morning meal    . Peppermint Oil (IBGARD) 90 MG CPCR See admin instructions.    . sildenafil (REVATIO) 20 MG tablet Take 2-5 tablets by mouth daily as needed.    . traMADol (ULTRAM) 50 MG tablet Take by mouth every 6 (six) hours as needed.     No current facility-administered medications for this visit.    PAST MEDICAL HISTORY: Past Medical History:  Diagnosis Date  . BPH (benign prostatic hyperplasia)   . BPH (benign prostatic hyperplasia)   . Cervical spinal stenosis    Dr Ellene Route  . Chronic pain disorder   . Diverticulosis   . DJD (degenerative joint disease)    multiple sites  . GERD (gastroesophageal reflux disease)   . Hyperlipidemia   . Inguinal hernia    bilateral inguinal herniorrhaphy Dr Kaylyn Lim  . Osteoarthritis    generalized  . PUD (peptic ulcer disease)    nsaid related 2013, [place]  PAST SURGICAL HISTORY: Past Surgical History:  Procedure Laterality Date  . CATARACT EXTRACTION Bilateral 10/2020  . CYST EXCISION Right 10/03/2019   Procedure: EXCISION MUCOID CYST WITH INTERPHALAGEAL JOINT ARTHROTOMY RIGHT THUMB;  Surgeon: Leanora Cover, MD;  Location: [facility];  Service: Orthopedics;  Laterality: Right;  Bier block  . ELBOW SURGERY Bilateral    bursectomy  . ESOPHAGOGASTRODUODENOSCOPY    . FOOT SURGERY Right   . HAND SURGERY Left   . HERNIA REPAIR Bilateral    inguinal  . KNEE ARTHROSCOPY Right   . KNEE SURGERY Left    Alvan Dame  . ROTATOR CUFF REPAIR Left    left, Dr Percell Miller  . SHOULDER ARTHROSCOPY Left   . VASECTOMY     Dr Risa Grill    FAMILY HISTORY: Family History  Problem Relation Age of Onset  . COPD Mother   . Pancreatic cancer Father   . Liver cancer Father   . Hyperlipidemia Sister   . Epilepsy Brother     SOCIAL HISTORY: Social History   Socioeconomic History  .  Marital status: Married    Spouse name: Mardene Celeste  . Number of children: 1  . Years of education: Not on file  . Highest education level: Some college, no degree  Occupational History    Comment: barber  Tobacco Use  . Smoking status: Former Smoker    Years: 30.00    Quit date: 12/18/1990    Years since quitting: 30.0  . Smokeless tobacco: Never Used  . Tobacco comment: hx 20-30 cigs daily  Substance and Sexual Activity  . Alcohol use: Never  . Drug use: Never  . Sexual activity: Not on file  Other Topics Concern  . Not on file  Social History Narrative   Lives with wife   Caffeine- 4-5 Diet Cokes daily   Social Determinants of Health   Financial Resource Strain: Not on file  Food Insecurity: Not on file  Transportation Needs: Not on file  Physical Activity: Not on file  Stress: Not on file  Social Connections: Not on file  Intimate Partner Violence: Not on file      Marcial Pacas, M.D. Ph.D.  [facility] [address], [place], [place] [address] Ph: [phone] Fax: [phone]  CC:  Lavone Orn, MD [place] [address] [place],  [place] [address]  Lavone Orn, MD

## 2020-12-18 LAB — CK: Total CK: 285 U/L (ref 41–331)

## 2020-12-18 LAB — SEDIMENTATION RATE: Sed Rate: 2 mm/hr (ref 0–30)

## 2020-12-18 LAB — ANA W/REFLEX IF POSITIVE: Anti Nuclear Antibody (ANA): NEGATIVE

## 2020-12-18 LAB — TSH: TSH: 1.55 u[IU]/mL (ref 0.450–4.500)

## 2020-12-18 LAB — C-REACTIVE PROTEIN: CRP: 2 mg/L (ref 0–10)

## 2020-12-18 LAB — RHEUMATOID FACTOR: Rheumatoid fact SerPl-aCnc: <10 IU/mL (ref <14.0)

## 2020-12-19 ENCOUNTER — Telehealth: Payer: Self-pay | Admitting: *Deleted

## 2020-12-19 NOTE — Telephone Encounter (Signed)
Called patient for normal laboratory result. Phone was answered but no one spoke. If patient calls back, phone staff may inform of normal labs.

## 2020-12-20 NOTE — Telephone Encounter (Signed)
Called and spoke w/ pt about labs per Dr. Rhea Belton note. He verbalized understanding. He plans on getting xray done at [facility] imaging. He has EMG/NCS scheduled for 01/16/21.

## 2020-12-31 ENCOUNTER — Ambulatory Visit
Admission: RE | Admit: 2020-12-31 | Discharge: 2020-12-31 | Disposition: A | Discharge status: Home / Self Care | Payer: Medicare HMO | Source: Ambulatory Visit | Attending: Neurology | Admitting: Neurology

## 2020-12-31 ENCOUNTER — Other Ambulatory Visit: Payer: Self-pay

## 2020-12-31 DIAGNOSIS — M1612 Unilateral primary osteoarthritis, left hip: Secondary | ICD-10-CM | POA: Diagnosis not present

## 2020-12-31 DIAGNOSIS — G8929 Other chronic pain: Secondary | ICD-10-CM

## 2020-12-31 DIAGNOSIS — R202 Paresthesia of skin: Secondary | ICD-10-CM

## 2020-12-31 DIAGNOSIS — M545 Low back pain, unspecified: Secondary | ICD-10-CM

## 2020-12-31 DIAGNOSIS — M25552 Pain in left hip: Secondary | ICD-10-CM

## 2020-12-31 DIAGNOSIS — R200 Anesthesia of skin: Secondary | ICD-10-CM

## 2021-01-16 ENCOUNTER — Ambulatory Visit (INDEPENDENT_AMBULATORY_CARE_PROVIDER_SITE_OTHER): Payer: Medicare HMO | Admitting: Neurology

## 2021-01-16 ENCOUNTER — Ambulatory Visit: Payer: Medicare HMO | Admitting: Neurology

## 2021-01-16 DIAGNOSIS — R200 Anesthesia of skin: Secondary | ICD-10-CM

## 2021-01-16 DIAGNOSIS — R202 Paresthesia of skin: Secondary | ICD-10-CM | POA: Diagnosis not present

## 2021-01-16 NOTE — Patient Instructions (Signed)
Meralgia Paresthetica - Lateral Femoral Cutaneous Neuropathy

## 2021-01-16 NOTE — Procedures (Signed)
Full Name: Stephen Carey Gender: Male MRN #: [id number] Date of Birth: [birth year]    Visit Date: 01/16/2021 09:52 Age: 65 Years Examining Physician: Marcial Pacas, MD  Referring Physician: Marcial Pacas, MD History: 65 years old complains of intermittent left lateral thigh paresthesia,  Summary of the test: Nerve conduction study: Bilateral sural, superficial peroneal sensory responses were normal.  Bilateral tibial, peroneal to EDB motor responses were normal.  H reflexes were normal and symmetric  Electromyography:  Selected needle examination of bilateral lower extremity muscles was normal.    Conclusion: This is a normal study.  There is no electrodiagnostic evidence of bilateral lower extremity neuropathy.   ------------------------------- Catalina Pizza.D.Ph.D.  [facility] [address] [place], [place] [address] Tel: [phone] Fax: [phone]  Verbal informed consent was obtained from the patient, patient was informed of potential risk of procedure, including bruising, bleeding, hematoma formation, infection, muscle weakness, muscle pain, numbness, among others.        [place]    Nerve / Sites Muscle Latency Ref. Amplitude Ref. Rel Amp Segments Distance Velocity Ref. Area    ms ms mV mV %  cm m/s m/s mVms  R Peroneal - EDB     Ankle EDB 4.7 ?6.5 3.8 ?2.0 100 Ankle - EDB 9   10.8     Fib head EDB 11.6  3.0  80 Fib head - Ankle 30 44 ?44 8.8     Pop fossa EDB 13.9  3.1  103 Pop fossa - Fib head 10 44 ?44 8.8         Pop fossa - Ankle      L Peroneal - EDB     Ankle EDB 4.7 ?6.5 5.3 ?2.0 100 Ankle - EDB 9   13.9     Fib head EDB 11.1  3.7  70.2 Fib head - Ankle 29 45 ?44 11.1     Pop fossa EDB 13.4  3.6  97.9 Pop fossa - Fib head 10 44 ?44 12.2         Pop fossa - Ankle      R Tibial - AH     Ankle AH 3.6 ?5.8 10.9 ?4.0 100 Ankle - AH 9   31.2     Pop fossa AH 13.7  5.6  50.9 Pop fossa - Ankle 42 41 ?41 25.1  L Tibial - AH      Ankle AH 5.1 ?5.8 10.7 ?4.0 100 Ankle - AH 9   21.7     Pop fossa AH 15.4  7.3  67.9 Pop fossa - Ankle 42 41 ?41 20.5             SNC    Nerve / Sites Rec. Site Peak Lat Ref.  Amp Ref. Segments Distance    ms ms V V  cm  R Sural - Ankle (Calf)     Calf Ankle 4.1 ?4.4 6 ?6 Calf - Ankle 14  L Sural - Ankle (Calf)     Calf Ankle 3.4 ?4.4 6 ?6 Calf - Ankle 14  R Superficial peroneal - Ankle     Lat leg Ankle 3.9 ?4.4 8 ?6 Lat leg - Ankle 14  L Superficial peroneal - Ankle     Lat leg Ankle 3.9 ?4.4 6 ?6 Lat leg - Ankle 14             F  Wave    Nerve F Lat Ref.  ms ms  R Tibial - AH 55.9 ?56.0  L Tibial - AH 59.1 ?56.0         H Reflex    Nerve H Lat   ms   Left Right Ref.  Tibial - [place] 40.2 39.5 ?35.0         EMG Summary Table    Spontaneous MUAP Recruitment  Muscle IA Fib PSW Fasc Other Amp Dur. Poly Pattern  L. Tibialis anterior Normal None None None _______ Normal Normal Normal Normal  L. Tibialis posterior Normal None None None _______ Normal Normal Normal Normal  L. Peroneus longus Normal None None None _______ Normal Normal Normal Normal  L. Gastrocnemius (Medial head) Normal None None None _______ Normal Normal Normal Normal  L. Vastus lateralis Normal None None None _______ Normal Normal Normal Normal  L. Adductor longus Normal None None None _______ Normal Normal Normal Normal  L. Rectus femoris Normal None None None _______ Normal Normal Normal Normal  R. Rectus femoris Normal None None None _______ Normal Normal Normal Normal  R. Vastus lateralis Normal None None None _______ Normal Normal Normal Normal  R. Adductor longus Normal None None None _______ Normal Normal Normal Normal

## 2021-01-16 NOTE — Progress Notes (Signed)
No chief complaint on file.     ASSESSMENT AND PLAN  Stephen Carey is a 65 y.o. male   Left groin area pain, left anterior thigh paresthesia  Likely due to combination of mild left hip arthritic changes, left lateral femoral cutaneous neuropathy  EMG nerve conduction study on January 16, 2021 showed no significant abnormality.  X-ray of left hip only showed mild degenerative changes  Gabapentin 100 mg 3 times daily as needed  Laboratory evaluations showed no significant abnormalities  DIAGNOSTIC DATA (LABS, IMAGING, TESTING) - I reviewed patient records, labs, notes, testing and imaging myself where available.  MRI of lumbar spine in March 2022: 1. Diffuse lumbar facet osteoarthritis greatest at L4-5 where there is marrow edema. 2. Moderate foraminal impingement on the right at L4-5. 3. Diffusely patent spinal canal.   MRI of cervical spine November 2019, Moderate to moderately severe bilateral foraminal narrowing due to uncovertebral spurring and right worse than left facet arthropathy at C3-4 appears worse on the prior examination. The cervical spine is otherwise stable in appearance.   Spondylosis appears worst overall at C6-7 where the cord is flattened and there is severe bilateral foraminal narrowing due to a disc osteophyte complex and uncovertebral disease.   Mild flattening of the ventral cord and moderate to moderately severe left foraminal narrowing C5-6.   Multilevel facet degenerative disease appears worst on the right at C4-5 and C7-T1.  Laboratory evaluation in January 2022, LDL 133, cholesterol 200, normal CMP, creatinine of 1.15,   Laboratory evaluation on Dec 17, 2020 showed normal Rheumatoid factor, ANA, ESR, C-reactive protein, CPK, TSH   HISTORICAL  Stephen Carey is a 65 year old male, seen in request by his primary care doctor Lavone Orn, for evaluation of left groin area pain, numbness, he is accompanied by his wife at today's visit on  Dec 17, 2020  I reviewed and summarized the referring note. PMHx. HLD History of Gastric ulcer Multiple joint surgery for sports related injury,  He still plays golf  3-4 times each week, described at the beginning, when he first stepped out of the car, he has low back stiffness, but after he walks for a while, he will loosen up, feeling much better, he denies gait abnormality, denies bowel and bladder incontinence, denies bilateral upper or lower extremity paresthesia,  Since 2019, he noticed intermittent numbness involving left anterior and lateral thigh, over 3 years, he denies significant change, but he noticed that his left quadriceps muscle a bit softer than the right side, while he played golf, he tends to pivot his body on his right leg, swing with his right arm,  He also complains of multiple joints pain, including enlarged right third finger mid phalangeal joints, painful upon palpation, he works as a Art gallery manager, denies difficulty 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,  He did have some neck pain,  We personally reviewed MRI of cervical spine in 2019, multilevel degenerative changes, no evidence of significant spinal canal stenosis, there is severe bilateral foraminal narrowing at C6 and 7 level  MRI of lumbar spine in March 2022: Diffuse lumbar facet osteoarthritis, greatest at L4-5, there is marrow edema, moderate foram showed no significant difference compared to inal narrowing, worsening on the right side  Update January 16, 2021: He return for electrodiagnostic study today, which was essentially normal, there was no evidence of left lower extremity neuropathy, there was no significant difference compared to right quadricep muscles  Continue complains of intermittent left lateral thigh paresthesia, but there is no  limitation in his daily activity  Multiple joints pain, deformity, x-ray of left hip showed mild arthritis changes   PHYSICAL EXAM:   There were no vitals filed for this visit.  Not  recorded     There is no height or weight on file to calculate BMI.  PHYSICAL EXAMNIATION:  Gen: NAD, conversant, well nourised, well groomed         NEUROLOGICAL EXAM:  MENTAL STATUS: Speech:/Cognition  Awake alert oriented to history taking and casual conversation   CRANIAL NERVES: CN II: Visual fields are full to confrontation. Pupils are round equal and briskly reactive to light. CN III, IV, VI: extraocular movement are normal. No ptosis. CN V: Facial sensation is intact to light touch CN VII: Face is symmetric with normal eye closure  CN VIII: Hearing is normal to causal conversation. CN IX, X: Phonation is normal. CN XI: Head turning and shoulder shrug are intact  MOTOR: There is no pronator drift of out-stretched arms. Muscle bulk and tone are normal. Muscle strength is normal.  REFLEXES: Reflexes are 2+ and symmetric at the biceps, triceps, knees, and ankles. Plantar responses are flexor.  SENSORY: Intact to light touch, pinprick and vibratory sensation are intact in fingers and toes.  COORDINATION: There is no trunk or limb dysmetria noted.  GAIT/STANCE: Gait is normal  REVIEW OF SYSTEMS:  Full 14 system review of systems performed and notable only for as above All other review of systems were negative.   ALLERGIES: Allergies  Allergen Reactions   Sulfa Antibiotics Hives    rash   Atorvastatin     myalgias   Doxycycline     REACTION: severe diarrhea   Penicillins     REACTION: as child   Percocet [Oxycodone-Acetaminophen]     GI upset   Sulfonamide Derivatives     REACTION: rash, nausea    HOME MEDICATIONS: Current Outpatient Medications  Medication Sig Dispense Refill   atorvastatin (LIPITOR) 20 MG tablet Take 20 mg by mouth daily.     fluticasone (FLONASE) 50 MCG/ACT nasal spray 1 spray in each nostril     gabapentin (NEURONTIN) 100 MG capsule Take 1 capsule (100 mg total) by mouth 3 (three) times daily as needed. 90 capsule 5    Omeprazole 20 MG TBEC 1 tablet 30 minutes before morning meal     Peppermint Oil (IBGARD) 90 MG CPCR See admin instructions.     sildenafil (REVATIO) 20 MG tablet Take 2-5 tablets by mouth daily as needed.     traMADol (ULTRAM) 50 MG tablet Take by mouth every 6 (six) hours as needed.     No current facility-administered medications for this visit.    PAST MEDICAL HISTORY: Past Medical History:  Diagnosis Date   BPH (benign prostatic hyperplasia)    BPH (benign prostatic hyperplasia)    Cervical spinal stenosis    Dr Ellene Route   Chronic pain disorder    Diverticulosis    DJD (degenerative joint disease)    multiple sites   GERD (gastroesophageal reflux disease)    Hyperlipidemia    Inguinal hernia    bilateral inguinal herniorrhaphy Dr Kaylyn Lim   Osteoarthritis    generalized   PUD (peptic ulcer disease)    [place] related 2013, [place]    PAST SURGICAL HISTORY: Past Surgical History:  Procedure Laterality Date   CATARACT EXTRACTION Bilateral 10/2020   CYST EXCISION Right 10/03/2019   Procedure: EXCISION MUCOID CYST WITH INTERPHALAGEAL JOINT ARTHROTOMY RIGHT THUMB;  Surgeon: Leanora Cover,  MD;  Location: [place] [facility];  Service: Orthopedics;  Laterality: Right;  Bier block   ELBOW SURGERY Bilateral    bursectomy   ESOPHAGOGASTRODUODENOSCOPY     FOOT SURGERY Right    HAND SURGERY Left    HERNIA REPAIR Bilateral    inguinal   KNEE ARTHROSCOPY Right    KNEE SURGERY Left    [place]   ROTATOR CUFF REPAIR Left    left, Dr Percell Miller   SHOULDER ARTHROSCOPY Left    VASECTOMY     Dr Risa Grill    FAMILY HISTORY: Family History  Problem Relation Age of Onset   COPD Mother    Pancreatic cancer Father    Liver cancer Father    Hyperlipidemia Sister    Epilepsy Brother     SOCIAL HISTORY: Social History   Socioeconomic History   Marital status: Married    Spouse name: Mardene Celeste   Number of children: 1   Years of education: Not on file   Highest education level: Some  college, no degree  Occupational History    Comment: Art gallery manager  Tobacco Use   Smoking status: Former    Years: 30.00    Pack years: 0.00    Types: Cigarettes    Quit date: 12/18/1990    Years since quitting: 30.1   Smokeless tobacco: Never   Tobacco comments:    hx 20-30 cigs daily  Substance and Sexual Activity   Alcohol use: Never   Drug use: Never   Sexual activity: Not on file  Other Topics Concern   Not on file  Social History Narrative   Lives with wife   Caffeine- 4-5 Diet Cokes daily   Social Determinants of Health   Financial Resource Strain: Not on file  Food Insecurity: Not on file  Transportation Needs: Not on file  Physical Activity: Not on file  Stress: Not on file  Social Connections: Not on file  Intimate Partner Violence: Not on file      Marcial Pacas, M.D. Ph.D.  [facility] [address] [place], [place] [address] Ph: [phone] Fax: [phone]  CC:  Lavone Orn, MD [place] [address] [employer] [address] [place],  [place] [address]  Lavone Orn, MD

## 2021-02-12 ENCOUNTER — Telehealth: Payer: Self-pay | Admitting: Neurology

## 2021-02-12 NOTE — Telephone Encounter (Signed)
I spoke to the patient. Reports worsening of back pain for two weeks. New intermittent pain in right ankle. Still has numbness in left leg. He has more difficulty with sitting and lying. Especially having a hard time getting out of bed each morning. Better with standing. Says gabapentin has not been helpful. However, he is not taking it regularly, only on occasion. 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

## 2021-02-12 NOTE — Telephone Encounter (Signed)
Pt says he is having an issue with the ache in his back, he had the MRI done. Pt says he did not know who to call, since he was here last he thought he would call us. Pt is requesting a call back.

## 2021-02-12 NOTE — Telephone Encounter (Signed)
I discussed this with Dr. Krista Blue. He should try to take his gabapentin 100mg , one cap TID on a more regular basis to manage his discomfort. He was concerned about daytime drowsiness and sleeping through the night. He may also take all 300mg  at bedtime, if he is having difficulty lying down and resting. He stands for most of the day w/ his job and is able to handle it. He is still playing golf. Some of his pain may be due to his osteoarthritis. Dr. Krista Blue would prefer him see his PCP for treatment so that his kidney function can be closely monitored (past creatinine 1.15). He is not having to follow up with [place] any longer. He will also speak to his PCP about managing future refills of gabapentin.

## 2021-02-25 DIAGNOSIS — L570 Actinic keratosis: Secondary | ICD-10-CM | POA: Diagnosis not present

## 2021-02-25 DIAGNOSIS — W908XXS Exposure to other nonionizing radiation, sequela: Secondary | ICD-10-CM | POA: Diagnosis not present

## 2021-02-25 DIAGNOSIS — D692 Other nonthrombocytopenic purpura: Secondary | ICD-10-CM | POA: Diagnosis not present

## 2021-02-25 DIAGNOSIS — Z85828 Personal history of other malignant neoplasm of skin: Secondary | ICD-10-CM | POA: Diagnosis not present

## 2021-02-25 DIAGNOSIS — M545 Low back pain, unspecified: Secondary | ICD-10-CM | POA: Diagnosis not present

## 2021-02-25 DIAGNOSIS — L578 Other skin changes due to chronic exposure to nonionizing radiation: Secondary | ICD-10-CM | POA: Diagnosis not present

## 2021-02-25 DIAGNOSIS — L821 Other seborrheic keratosis: Secondary | ICD-10-CM | POA: Diagnosis not present

## 2021-02-27 DIAGNOSIS — M545 Low back pain, unspecified: Secondary | ICD-10-CM | POA: Diagnosis not present

## 2021-03-04 DIAGNOSIS — M545 Low back pain, unspecified: Secondary | ICD-10-CM | POA: Diagnosis not present

## 2021-03-15 DIAGNOSIS — M5416 Radiculopathy, lumbar region: Secondary | ICD-10-CM | POA: Diagnosis not present

## 2021-04-03 DIAGNOSIS — M545 Low back pain, unspecified: Secondary | ICD-10-CM | POA: Diagnosis not present

## 2021-04-10 DIAGNOSIS — M545 Low back pain, unspecified: Secondary | ICD-10-CM | POA: Diagnosis not present

## 2021-04-11 DIAGNOSIS — M545 Low back pain, unspecified: Secondary | ICD-10-CM | POA: Diagnosis not present

## 2021-04-29 DIAGNOSIS — M545 Low back pain, unspecified: Secondary | ICD-10-CM | POA: Diagnosis not present

## 2021-05-13 DIAGNOSIS — M545 Low back pain, unspecified: Secondary | ICD-10-CM | POA: Diagnosis not present

## 2021-05-16 ENCOUNTER — Other Ambulatory Visit: Payer: Self-pay | Admitting: Gastroenterology

## 2021-05-16 DIAGNOSIS — R109 Unspecified abdominal pain: Secondary | ICD-10-CM

## 2021-05-28 DIAGNOSIS — R109 Unspecified abdominal pain: Secondary | ICD-10-CM | POA: Diagnosis not present

## 2021-06-03 ENCOUNTER — Ambulatory Visit
Admission: RE | Admit: 2021-06-03 | Discharge: 2021-06-03 | Disposition: A | Discharge status: Home / Self Care | Payer: Medicare HMO | Source: Ambulatory Visit | Attending: Gastroenterology | Admitting: Gastroenterology

## 2021-06-03 ENCOUNTER — Other Ambulatory Visit: Payer: Self-pay

## 2021-06-03 DIAGNOSIS — Z9889 Other specified postprocedural states: Secondary | ICD-10-CM | POA: Diagnosis not present

## 2021-06-03 DIAGNOSIS — K3580 Unspecified acute appendicitis: Secondary | ICD-10-CM | POA: Diagnosis not present

## 2021-06-03 DIAGNOSIS — R109 Unspecified abdominal pain: Secondary | ICD-10-CM

## 2021-06-03 DIAGNOSIS — K3189 Other diseases of stomach and duodenum: Secondary | ICD-10-CM | POA: Diagnosis not present

## 2021-06-03 DIAGNOSIS — K5730 Diverticulosis of large intestine without perforation or abscess without bleeding: Secondary | ICD-10-CM | POA: Diagnosis not present

## 2021-06-03 MED ORDER — IOPAMIDOL (ISOVUE-300) INJECTION 61%: 100.0000 mL | Freq: Once | INTRAVENOUS | Status: DC | PRN

## 2021-06-03 MED ORDER — IOPAMIDOL (ISOVUE-300) INJECTION 61%
100.0000 mL | Freq: Once | INTRAVENOUS | Status: AC | PRN
  Administered 2021-06-03: 100 mL via INTRAVENOUS

## 2021-06-04 DIAGNOSIS — R1031 Right lower quadrant pain: Secondary | ICD-10-CM | POA: Diagnosis not present

## 2021-06-12 ENCOUNTER — Other Ambulatory Visit: Payer: Self-pay | Admitting: Surgery

## 2021-06-12 DIAGNOSIS — K389 Disease of appendix, unspecified: Secondary | ICD-10-CM | POA: Diagnosis not present

## 2021-06-14 NOTE — Progress Notes (Signed)
Surgical Instructions    Your procedure is scheduled on 06/26/21.  Report to [facility] Main Entrance "A" at 9:30 A.M., then check in with the Admitting office.  Call this number if you have problems the morning of surgery:  [phone]   If you have any questions prior to your surgery date call [phone]: Open Monday-Friday 8am-4pm    Remember:  Do not eat after midnight the night before your surgery  You may drink clear liquids until 8:30am the morning of your surgery.   Clear liquids allowed are: Water, Non-Citrus Juices (without pulp), Carbonated Beverages, Clear Tea, Black Coffee ONLY (NO MILK, CREAM OR POWDERED CREAMER of any kind), and Gatorade    Take these medicines the morning of surgery with A SIP OF WATER  atorvastatin (LIPITOR)  fluticasone (FLONASE)  Omeprazole  IF NEEDED: gabapentin (NEURONTIN) traMADol (ULTRAM)   As of today, STOP taking any Aspirin (unless otherwise instructed by your surgeon) Aleve, Naproxen, Ibuprofen, Motrin, Advil, Goody's, BC's, all herbal medications, fish oil, and all vitamins.     After your COVID test   You are not required to quarantine however you are required to wear a well-fitting mask when you are out and around people not in your household.  If your mask becomes wet or soiled, replace with a new one.  Wash your hands often with soap and water for 20 seconds or clean your hands with an alcohol-based hand sanitizer that contains at least 60% alcohol.  Do not share personal items.  Notify your provider: if you are in close contact with someone who has COVID  or if you develop a fever of 100.4 or greater, sneezing, cough, sore throat, shortness of breath or body aches.             Do not wear jewelry or makeup Do not wear lotions, powders, perfumes/colognes, or deodorant. Men may shave face and neck. Do not bring valuables to the hospital. DO Not wear nail polish, gel polish, artificial nails, or any other type of  covering on natural nails including finger and toenails. If patients have artificial nails, gel coating, etc. that need to be removed by a nail salon, please have this removed prior to surgery or surgery may need to be canceled/delayed if the surgeon/ anesthesia feels like the patient is unable to be adequately monitored.             [facility] is not responsible for any belongings or valuables.  Do NOT Smoke (Tobacco/Vaping)  24 hours prior to your procedure  If you use a CPAP at night, you may bring your mask for your overnight stay.   Contacts, glasses, hearing aids, dentures or partials may not be worn into surgery, please bring cases for these belongings   For patients admitted to the hospital, discharge time will be determined by your treatment team.   Patients discharged the day of surgery will not be allowed to drive home, and someone needs to stay with them for 24 hours.  NO VISITORS WILL BE ALLOWED IN PRE-OP WHERE PATIENTS ARE PREPPED FOR SURGERY.  ONLY 1 SUPPORT PERSON MAY BE PRESENT IN THE WAITING ROOM WHILE YOU ARE IN SURGERY.  IF YOU ARE TO BE ADMITTED, ONCE YOU ARE IN YOUR ROOM YOU WILL BE ALLOWED TWO (2) VISITORS. 1 (ONE) VISITOR MAY STAY OVERNIGHT BUT MUST ARRIVE TO THE ROOM BY 8pm.  Minor children may have two parents present. Special consideration for safety and communication needs will be reviewed on  a case by case basis.  Special instructions:    Oral Hygiene is also important to reduce your risk of infection.  Remember - BRUSH YOUR TEETH THE MORNING OF SURGERY WITH YOUR REGULAR TOOTHPASTE   [place]- Preparing For Surgery  Before surgery, you can play an important role. Because skin is not sterile, your skin needs to be as free of germs as possible. You can reduce the number of germs on your skin by washing with CHG (chlorahexidine gluconate) Soap before surgery.  CHG is an antiseptic cleaner which kills germs and bonds with the skin to continue killing germs even  after washing.     Please do not use if you have an allergy to CHG or antibacterial soaps. If your skin becomes reddened/irritated stop using the CHG.  Do not shave (including legs and underarms) for at least 48 hours prior to first CHG shower. It is OK to shave your face.  Please follow these instructions carefully.     Shower the NIGHT BEFORE SURGERY and the MORNING OF SURGERY with CHG Soap.   If you chose to wash your hair, wash your hair first as usual with your normal shampoo. After you shampoo, rinse your hair and body thoroughly to remove the shampoo.  Then [employer] and genitals (private parts) with your normal soap and rinse thoroughly to remove soap.  After that Use CHG Soap as you would any other liquid soap. You can apply CHG directly to the skin and wash gently with a scrungie or a clean washcloth.   Apply the CHG Soap to your body ONLY FROM THE NECK DOWN.  Do not use on open wounds or open sores. Avoid contact with your eyes, ears, mouth and genitals (private parts). Wash Face and genitals (private parts)  with your normal soap.   Wash thoroughly, paying special attention to the area where your surgery will be performed.  Thoroughly rinse your body with warm water from the neck down.  DO NOT shower/wash with your normal soap after using and rinsing off the CHG Soap.  Pat yourself dry with a CLEAN TOWEL.  Wear CLEAN PAJAMAS to bed the night before surgery  Place CLEAN SHEETS on your bed the night before your surgery  DO NOT SLEEP WITH PETS.   Day of Surgery: Take a shower with CHG soap. Wear Clean/Comfortable clothing the morning of surgery Do not apply any deodorants/lotions.   Remember to brush your teeth WITH YOUR REGULAR TOOTHPASTE.   Please read over the following fact sheets that you were given.

## 2021-06-17 ENCOUNTER — Encounter (HOSPITAL_COMMUNITY)
Admission: RE | Admit: 2021-06-17 | Discharge: 2021-06-17 | Disposition: A | Discharge status: Home / Self Care | Payer: Medicare HMO | Source: Ambulatory Visit | Attending: Surgery | Admitting: Surgery

## 2021-06-17 ENCOUNTER — Other Ambulatory Visit: Payer: Self-pay

## 2021-06-17 ENCOUNTER — Encounter (HOSPITAL_COMMUNITY): Payer: Self-pay

## 2021-06-17 VITALS — BP 138/79 | HR 66 | Temp 97.5°F | Resp 18 | Ht 72.0 in | Wt 228.0 lb

## 2021-06-17 DIAGNOSIS — Z01812 Encounter for preprocedural laboratory examination: Secondary | ICD-10-CM | POA: Diagnosis present

## 2021-06-17 DIAGNOSIS — Z01818 Encounter for other preprocedural examination: Secondary | ICD-10-CM

## 2021-06-17 LAB — CBC
HCT: 49.1 % (ref 39.0–52.0)
Hemoglobin: 15.9 g/dL (ref 13.0–17.0)
MCH: 31.6 pg (ref 26.0–34.0)
MCHC: 32.4 g/dL (ref 30.0–36.0)
MCV: 97.6 fL (ref 80.0–100.0)
Platelets: 186 10*3/uL (ref 150–400)
RBC: 5.03 MIL/uL (ref 4.22–5.81)
RDW: 12.5 % (ref 11.5–15.5)
WBC: 6.9 10*3/uL (ref 4.0–10.5)
nRBC: 0 % (ref 0.0–0.2)

## 2021-06-17 NOTE — Progress Notes (Signed)
PCP - Dr. Lavone Orn Cardiologist - denies  PPM/ICD - n/a Device Orders - n/a Rep Notified - n/a  Chest x-ray - n/a EKG - n/a Stress Test - denies ECHO - denies Cardiac Cath - denies  Sleep Study - denies CPAP - n/a  Fasting Blood Sugar - n/a Checks Blood Sugar _____ times a day- n/a  Blood Thinner Instructions: n/a Aspirin Instructions: n/a  ERAS Protcol - Yes PRE-SURGERY Ensure or G2- n/a  COVID TEST- Scheduled for 06/24/21 at 9 am. Patient verbalized understanding of date, time, and location.   Anesthesia review: No  Patient denies shortness of breath, fever, cough and chest pain at PAT appointment   All instructions explained to the patient, with a verbal understanding of the material. Patient agrees to go over the instructions while at home for a better understanding. The opportunity to ask questions was provided.

## 2021-06-17 NOTE — Progress Notes (Signed)
Surgical Instructions    Your procedure is scheduled on 06/26/21.  Report to [facility] Main Entrance "A" at 9:30 A.M., then check in with the Admitting office.  Call this number if you have problems the morning of surgery:  [phone]   If you have any questions prior to your surgery date call [phone]: Open Monday-Friday 8am-4pm    Remember:  Do not eat after midnight the night before your surgery  You may drink clear liquids until 8:30am the morning of your surgery.   Clear liquids allowed are: Water, Non-Citrus Juices (without pulp), Carbonated Beverages, Clear Tea, Black Coffee ONLY (NO MILK, CREAM OR POWDERED CREAMER of any kind), and Gatorade    Take these medicines the morning of surgery with A SIP OF WATER  loratadine (CLARITIN) rosuvastatin (CRESTOR)   IF NEEDED: acetaminophen (TYLENOL) fluticasone (FLONASE) traMADol (ULTRAM)   As of today, STOP taking any Aspirin (unless otherwise instructed by your surgeon) Aleve, Naproxen, Ibuprofen, Motrin, Advil, Goody's, BC's, all herbal medications, fish oil, and all vitamins.     After your COVID test   You are not required to quarantine however you are required to wear a well-fitting mask when you are out and around people not in your household.  If your mask becomes wet or soiled, replace with a new one.  Wash your hands often with soap and water for 20 seconds or clean your hands with an alcohol-based hand sanitizer that contains at least 60% alcohol.  Do not share personal items.  Notify your provider: if you are in close contact with someone who has COVID  or if you develop a fever of 100.4 or greater, sneezing, cough, sore throat, shortness of breath or body aches.             Do not wear jewelry or makeup Do not wear lotions, powders, perfumes/colognes, or deodorant. Men may shave face and neck. Do not bring valuables to the hospital. DO Not wear nail polish, gel polish, artificial nails, or any other  type of covering on natural nails including finger and toenails. If patients have artificial nails, gel coating, etc. that need to be removed by a nail salon, please have this removed prior to surgery or surgery may need to be canceled/delayed if the surgeon/ anesthesia feels like the patient is unable to be adequately monitored.             [facility] is not responsible for any belongings or valuables.  Do NOT Smoke (Tobacco/Vaping)  24 hours prior to your procedure  If you use a CPAP at night, you may bring your mask for your overnight stay.   Contacts, glasses, hearing aids, dentures or partials may not be worn into surgery, please bring cases for these belongings   For patients admitted to the hospital, discharge time will be determined by your treatment team.   Patients discharged the day of surgery will not be allowed to drive home, and someone needs to stay with them for 24 hours.  NO VISITORS WILL BE ALLOWED IN PRE-OP WHERE PATIENTS ARE PREPPED FOR SURGERY.  ONLY 1 SUPPORT PERSON MAY BE PRESENT IN THE WAITING ROOM WHILE YOU ARE IN SURGERY.  IF YOU ARE TO BE ADMITTED, ONCE YOU ARE IN YOUR ROOM YOU WILL BE ALLOWED TWO (2) VISITORS. 1 (ONE) VISITOR MAY STAY OVERNIGHT BUT MUST ARRIVE TO THE ROOM BY 8pm.  Minor children may have two parents present. Special consideration for safety and communication needs will be reviewed on  a case by case basis.  Special instructions:    Oral Hygiene is also important to reduce your risk of infection.  Remember - BRUSH YOUR TEETH THE MORNING OF SURGERY WITH YOUR REGULAR TOOTHPASTE   [facility]- Preparing For [facility]  Before surgery, you can play an important role. Because skin is not sterile, your skin needs to be as free of germs as possible. You can reduce the number of germs on your skin by washing with CHG (chlorahexidine gluconate) Soap before surgery.  CHG is an antiseptic cleaner which kills germs and bonds with the skin to continue killing germs  even after washing.     Please do not use if you have an allergy to CHG or antibacterial soaps. If your skin becomes reddened/irritated stop using the CHG.  Do not shave (including legs and underarms) for at least 48 hours prior to first CHG shower. It is OK to shave your face.  Please follow these instructions carefully.     Shower the NIGHT BEFORE SURGERY and the MORNING OF SURGERY with CHG Soap.   If you chose to wash your hair, wash your hair first as usual with your normal shampoo. After you shampoo, rinse your hair and body thoroughly to remove the shampoo.  Then [employer] and genitals (private parts) with your normal soap and rinse thoroughly to remove soap.  After that Use CHG Soap as you would any other liquid soap. You can apply CHG directly to the skin and wash gently with a scrungie or a clean washcloth.   Apply the CHG Soap to your body ONLY FROM THE NECK DOWN.  Do not use on open wounds or open sores. Avoid contact with your eyes, ears, mouth and genitals (private parts). Wash Face and genitals (private parts)  with your normal soap.   Wash thoroughly, paying special attention to the area where your surgery will be performed.  Thoroughly rinse your body with warm water from the neck down.  DO NOT shower/wash with your normal soap after using and rinsing off the CHG Soap.  Pat yourself dry with a CLEAN TOWEL.  Wear CLEAN PAJAMAS to bed the night before surgery  Place CLEAN SHEETS on your bed the night before your surgery  DO NOT SLEEP WITH PETS.   Day of Surgery: Take a shower with CHG soap. Wear Clean/Comfortable clothing the morning of surgery Do not apply any deodorants/lotions.   Remember to brush your teeth WITH YOUR REGULAR TOOTHPASTE.   Please read over the following fact sheets that you were given.

## 2021-06-24 ENCOUNTER — Other Ambulatory Visit (HOSPITAL_COMMUNITY)
Admission: RE | Admit: 2021-06-24 | Discharge: 2021-06-24 | Disposition: A | Discharge status: Home / Self Care | Payer: Medicare HMO | Source: Ambulatory Visit | Attending: Surgery | Admitting: Surgery

## 2021-06-24 DIAGNOSIS — Z01818 Encounter for other preprocedural examination: Secondary | ICD-10-CM

## 2021-06-24 DIAGNOSIS — Z01812 Encounter for preprocedural laboratory examination: Secondary | ICD-10-CM | POA: Insufficient documentation

## 2021-06-24 DIAGNOSIS — Z20822 Contact with and (suspected) exposure to covid-19: Secondary | ICD-10-CM | POA: Insufficient documentation

## 2021-06-24 LAB — SARS CORONAVIRUS 2 (TAT 6-24 HRS): SARS Coronavirus 2: NEGATIVE

## 2021-06-25 NOTE — H&P (Signed)
REFERRING PHYSICIAN: Arta Silence, MD  PROVIDER: Beverlee Nims, MD  MRN: [id number] DOB: 1955-11-21 DATE OF ENCOUNTER: 06/12/2021 Subjective  Chief Complaint: New Consultation (RLQ pain early appendicitis?)   History of Present Illness: Stephen Carey is a 64 y.o. male who is seen today as an office consultation at the request of Dr. Paulita Fujita for evaluation of New Consultation (RLQ pain early appendicitis?) .   This is a 66 year old gentleman sent here for evaluation of an abnormality of his appendix. He has a known history of diverticulosis. He has been having vague abdominal pain with occasional nausea and some episodes of emesis as well as changes in bowel habits over the past month. He had a CT scan of the abdomen pelvis 9 days ago which was unremarkable except for a dilated appendix. There was no periappendiceal inflammation. The liver was normal. There was no adenopathy. He reports that he still has discomfort in his abdomen which is still mostly around the right lower quadrant. He has no weight loss. He is just finished a course of antibiotics.  Review of Systems: A complete review of systems was obtained from the patient. I have reviewed this information and discussed as appropriate with the patient. See HPI as well for other ROS.  Review of Systems  All other systems reviewed and are negative.   Medical History: Past Medical History:  Diagnosis Date   Arthritis   GERD (gastroesophageal reflux disease)   Hyperlipidemia   Patient Active Problem List  Diagnosis   Benign prostatic hyperplasia without lower urinary tract symptoms   Body mass index (BMI) 33.0-33.9, adult   Obesity   Cervical spinal stenosis   Cervical spondylosis   Chronic bilateral low back pain without sciatica   Chronic pain syndrome   Generalized osteoarthritis   Left hip pain   Mixed hyperlipidemia   Numbness and tingling   Osteoarthritis of finger of right hand   Upper abdominal pain    Past Surgical History:  Procedure Laterality Date   Cataract Surgery   Elbow Surgery Bilateral   Foot Surgery   Hand Surgery   HERNIA REPAIR   Knee Surgery Bilateral   Shoulder Surgery    Allergies  Allergen Reactions   Penicillins Other (See Comments), Rash and Unknown  REACTION: as child REACTION: as child Unknown, childhood   Atorvastatin Other (See Comments)  myalgias   Oxycodone-Acetaminophen Other (See Comments)  GI upset   Doxycycline Diarrhea, Other (See Comments) and Rash  REACTION: severe diarrhea REACTION: severe diarrhea REACTION: severe diarrhea   Sulfa (Sulfonamide Antibiotics) Hives and Rash  REACTION: rash, nausea   Current Outpatient Medications on File Prior to Visit  Medication Sig Dispense Refill   fluticasone propionate (FLONASE) 50 mcg/actuation nasal spray 1 spray in each nostril   gabapentin (NEURONTIN) 100 MG capsule 3 capsule   gabapentin (NEURONTIN) 300 MG capsule 1 capsule   omeprazole (PRILOSEC) 20 MG DR capsule Take 1 capsule (20 mg total) by mouth once daily   peppermint oiL (IBGARD) 90 mg CECX See admin instructions.   predniSONE (DELTASONE) 20 MG tablet 2 tablets   rosuvastatin (CRESTOR) 10 MG tablet Take 1 tablet (10 mg total) by mouth once daily   sildenafil (REVATIO) 20 mg tablet 2-5 tablet   traMADoL (ULTRAM) 50 mg tablet 2 tablets   No current facility-administered medications on file prior to visit.   History reviewed. No pertinent family history.   Social History   Tobacco Use  Smoking Status Never  Smokeless Tobacco Never  Social History   Socioeconomic History   Marital status: Married  Tobacco Use   Smoking status: Never   Smokeless tobacco: Never  Vaping Use   Vaping Use: Never used  Substance and Sexual Activity   Alcohol use: Never   Drug use: Never   Objective:   Vitals:  06/12/21 1027  BP: 130/70  Pulse: 88  Temp: 36.7 C (98.1 F)  SpO2: 95%  Weight: (!) 104.1 kg (229 lb 6.4 oz)   Height: 182.9 cm (6')   Body mass index is 31.11 kg/m.  Physical Exam   He appears well and comfortable on exam  He is ambulating well.  His abdomen is obese. It is soft. There is some mild guarding with tenderness in the right lower abdomen. There are no hernias  Lungs clear  CV RRR  Neuro grossly intact  Labs, Imaging and Diagnostic Testing: I reviewed the CAT scan of his abdomen and pelvis  Assessment and Plan:  Diagnoses and all orders for this visit:  Appendix disease    I reviewed his notes from his gastroenterologist. I reviewed his CT scan and laboratory data. He does have an abnormal appearing appendix on CT scan. Given his symptoms I am worried this could be a mucocele or malignancy of the appendix. I cannot rule out a chronic appendicitis. I believe he needs a laparoscopic appendectomy. I discussed the reasons for this with him. I discussed the surgical procedure in detail. We discussed the risk which includes but is not limited to bleeding, infection, injury to surrounding structures, the need for a partial colectomy, the potential for findings of malignancy. The need to convert to an open procedure, cardiopulmonary issues, DVT, etc. He agrees to proceed. Surgery scheduled urgently.

## 2021-06-25 NOTE — Progress Notes (Signed)
Preop labs

## 2021-06-26 ENCOUNTER — Other Ambulatory Visit: Payer: Self-pay

## 2021-06-26 ENCOUNTER — Encounter (HOSPITAL_COMMUNITY): Payer: Self-pay | Admitting: Surgery

## 2021-06-26 ENCOUNTER — Ambulatory Visit (HOSPITAL_COMMUNITY): Payer: Medicare HMO | Admitting: Anesthesiology

## 2021-06-26 ENCOUNTER — Ambulatory Visit (HOSPITAL_COMMUNITY)
Admission: RE | Admit: 2021-06-26 | Discharge: 2021-06-26 | Disposition: A | Discharge status: Home / Self Care | Payer: Medicare HMO | Attending: Surgery | Admitting: Surgery

## 2021-06-26 ENCOUNTER — Encounter (HOSPITAL_COMMUNITY)
Admission: RE | Disposition: A | Discharge status: Home / Self Care | Payer: Self-pay | Source: Home / Self Care | Attending: Surgery

## 2021-06-26 DIAGNOSIS — Z87891 Personal history of nicotine dependence: Secondary | ICD-10-CM | POA: Diagnosis not present

## 2021-06-26 DIAGNOSIS — C181 Malignant neoplasm of appendix: Secondary | ICD-10-CM | POA: Insufficient documentation

## 2021-06-26 DIAGNOSIS — K219 Gastro-esophageal reflux disease without esophagitis: Secondary | ICD-10-CM | POA: Insufficient documentation

## 2021-06-26 DIAGNOSIS — R1031 Right lower quadrant pain: Secondary | ICD-10-CM | POA: Diagnosis present

## 2021-06-26 DIAGNOSIS — K5790 Diverticulosis of intestine, part unspecified, without perforation or abscess without bleeding: Secondary | ICD-10-CM | POA: Diagnosis not present

## 2021-06-26 DIAGNOSIS — K388 Other specified diseases of appendix: Secondary | ICD-10-CM | POA: Diagnosis not present

## 2021-06-26 HISTORY — PX: LAPAROSCOPIC APPENDECTOMY: SHX408

## 2021-06-26 SURGERY — APPENDECTOMY, LAPAROSCOPIC
Anesthesia: General | Site: Abdomen

## 2021-06-26 MED ORDER — SUGAMMADEX SODIUM 200 MG/2ML IV SOLN
INTRAVENOUS | Status: DC | PRN
  Administered 2021-06-26: 400 mg via INTRAVENOUS

## 2021-06-26 MED ORDER — MIDAZOLAM HCL 2 MG/2ML IJ SOLN
INTRAMUSCULAR | Status: AC
  Filled 2021-06-26: qty 2

## 2021-06-26 MED ORDER — FENTANYL CITRATE (PF) 100 MCG/2ML IJ SOLN
INTRAMUSCULAR | Status: AC
  Filled 2021-06-26: qty 2

## 2021-06-26 MED ORDER — ORAL CARE MOUTH RINSE: 15.0000 mL | Freq: Once | OROMUCOSAL | Status: AC

## 2021-06-26 MED ORDER — FENTANYL CITRATE (PF) 250 MCG/5ML IJ SOLN
INTRAMUSCULAR | Status: AC
  Filled 2021-06-26: qty 5

## 2021-06-26 MED ORDER — CIPROFLOXACIN IN D5W 400 MG/200ML IV SOLN
400.0000 mg | INTRAVENOUS | Status: AC
  Administered 2021-06-26: 400 mg via INTRAVENOUS
  Filled 2021-06-26: qty 200

## 2021-06-26 MED ORDER — PROPOFOL 10 MG/ML IV BOLUS
INTRAVENOUS | Status: AC
  Filled 2021-06-26: qty 20

## 2021-06-26 MED ORDER — ONDANSETRON HCL 4 MG/2ML IJ SOLN
INTRAMUSCULAR | Status: DC | PRN
  Administered 2021-06-26: 4 mg via INTRAVENOUS

## 2021-06-26 MED ORDER — 0.9 % SODIUM CHLORIDE (POUR BTL) OPTIME
TOPICAL | Status: DC | PRN
  Administered 2021-06-26: 1000 mL

## 2021-06-26 MED ORDER — LACTATED RINGERS IV SOLN: INTRAVENOUS | Status: DC

## 2021-06-26 MED ORDER — BUPIVACAINE-EPINEPHRINE 0.5% -1:200000 IJ SOLN
INTRAMUSCULAR | Status: DC | PRN
  Administered 2021-06-26: 20 mL

## 2021-06-26 MED ORDER — ACETAMINOPHEN 500 MG PO TABS
1000.0000 mg | ORAL_TABLET | ORAL | Status: AC
  Administered 2021-06-26: 1000 mg via ORAL
  Filled 2021-06-26: qty 2

## 2021-06-26 MED ORDER — PROPOFOL 10 MG/ML IV BOLUS
INTRAVENOUS | Status: DC | PRN
  Administered 2021-06-26: 200 mg via INTRAVENOUS

## 2021-06-26 MED ORDER — ROCURONIUM BROMIDE 10 MG/ML (PF) SYRINGE
PREFILLED_SYRINGE | INTRAVENOUS | Status: DC | PRN
  Administered 2021-06-26: 60 mg via INTRAVENOUS

## 2021-06-26 MED ORDER — CHLORHEXIDINE GLUCONATE CLOTH 2 % EX PADS: 6.0000 | MEDICATED_PAD | Freq: Once | CUTANEOUS | Status: DC

## 2021-06-26 MED ORDER — LIDOCAINE HCL (CARDIAC) PF 100 MG/5ML IV SOSY
PREFILLED_SYRINGE | INTRAVENOUS | Status: DC | PRN
  Administered 2021-06-26: 30 mg via INTRAVENOUS

## 2021-06-26 MED ORDER — DEXAMETHASONE SODIUM PHOSPHATE 10 MG/ML IJ SOLN
INTRAMUSCULAR | Status: DC | PRN
  Administered 2021-06-26: 5 mg via INTRAVENOUS

## 2021-06-26 MED ORDER — CHLORHEXIDINE GLUCONATE 0.12 % MT SOLN
15.0000 mL | Freq: Once | OROMUCOSAL | Status: AC
  Administered 2021-06-26: 15 mL via OROMUCOSAL
  Filled 2021-06-26: qty 15

## 2021-06-26 MED ORDER — SODIUM CHLORIDE 0.9 % IR SOLN
Status: DC | PRN
  Administered 2021-06-26: 1000 mL

## 2021-06-26 MED ORDER — FENTANYL CITRATE (PF) 100 MCG/2ML IJ SOLN
INTRAMUSCULAR | Status: DC | PRN
  Administered 2021-06-26: 150 ug via INTRAVENOUS
  Administered 2021-06-26: 100 ug via INTRAVENOUS

## 2021-06-26 MED ORDER — METRONIDAZOLE 500 MG/100ML IV SOLN
500.0000 mg | INTRAVENOUS | Status: AC
  Administered 2021-06-26: 500 mg via INTRAVENOUS
  Filled 2021-06-26: qty 100

## 2021-06-26 MED ORDER — TRAMADOL HCL 50 MG PO TABS: 50.0000 mg | ORAL_TABLET | Freq: Four times a day (QID) | ORAL | 0 refills | Status: DC | PRN

## 2021-06-26 MED ORDER — MIDAZOLAM HCL 5 MG/5ML IJ SOLN
INTRAMUSCULAR | Status: DC | PRN
  Administered 2021-06-26: 2 mg via INTRAVENOUS

## 2021-06-26 MED ORDER — LACTATED RINGERS IV SOLN: INTRAVENOUS | Status: DC | PRN

## 2021-06-26 MED ORDER — FENTANYL CITRATE (PF) 100 MCG/2ML IJ SOLN
25.0000 ug | INTRAMUSCULAR | Status: DC | PRN
  Administered 2021-06-26: 50 ug via INTRAVENOUS

## 2021-06-26 MED ORDER — ONDANSETRON HCL 4 MG/2ML IJ SOLN: 4.0000 mg | Freq: Four times a day (QID) | INTRAMUSCULAR | Status: DC | PRN

## 2021-06-26 SURGICAL SUPPLY — 40 items
APPLIER CLIP 5 13 M/L LIGAMAX5 (MISCELLANEOUS)
APPLIER CLIP ROT 10 11.4 M/L (STAPLE)
BAG COUNTER SPONGE SURGICOUNT (BAG) ×2 IMPLANT
CANISTER SUCT 3000ML PPV (MISCELLANEOUS) ×2 IMPLANT
CHLORAPREP W/TINT 26 (MISCELLANEOUS) ×2 IMPLANT
CLIP APPLIE 5 13 M/L LIGAMAX5 (MISCELLANEOUS) IMPLANT
CLIP APPLIE ROT 10 11.4 M/L (STAPLE) IMPLANT
COVER SURGICAL LIGHT HANDLE (MISCELLANEOUS) ×2 IMPLANT
CUTTER FLEX LINEAR 45M (STAPLE) IMPLANT
DERMABOND ADVANCED (GAUZE/BANDAGES/DRESSINGS) ×1
DERMABOND ADVANCED .7 DNX12 (GAUZE/BANDAGES/DRESSINGS) ×1 IMPLANT
ELECT REM PT RETURN 9FT ADLT (ELECTROSURGICAL) ×2
ELECTRODE REM PT RTRN 9FT ADLT (ELECTROSURGICAL) ×1 IMPLANT
GAUZE 4X4 16PLY ~~LOC~~+RFID DBL (SPONGE) ×2 IMPLANT
GLOVE SURG SIGNA 7.5 PF LTX (GLOVE) ×2 IMPLANT
GOWN STRL REUS W/ TWL LRG LVL3 (GOWN DISPOSABLE) ×2 IMPLANT
GOWN STRL REUS W/ TWL XL LVL3 (GOWN DISPOSABLE) ×1 IMPLANT
GOWN STRL REUS W/TWL LRG LVL3 (GOWN DISPOSABLE) ×4
GOWN STRL REUS W/TWL XL LVL3 (GOWN DISPOSABLE) ×2
KIT BASIN OR (CUSTOM PROCEDURE TRAY) ×2 IMPLANT
KIT TURNOVER KIT B (KITS) ×2 IMPLANT
NS IRRIG 1000ML POUR BTL (IV SOLUTION) ×2 IMPLANT
PAD ARMBOARD 7.5X6 YLW CONV (MISCELLANEOUS) ×4 IMPLANT
POUCH SPECIMEN RETRIEVAL 10MM (ENDOMECHANICALS) ×2 IMPLANT
RELOAD 45 VASCULAR/THIN (ENDOMECHANICALS) IMPLANT
RELOAD STAPLE TA45 3.5 REG BLU (ENDOMECHANICALS) ×2 IMPLANT
SET IRRIG TUBING LAPAROSCOPIC (IRRIGATION / IRRIGATOR) ×2 IMPLANT
SET TUBE SMOKE EVAC HIGH FLOW (TUBING) ×2 IMPLANT
SHEARS HARMONIC ACE PLUS 36CM (ENDOMECHANICALS) ×2 IMPLANT
SLEEVE ENDOPATH XCEL 5M (ENDOMECHANICALS) ×2 IMPLANT
SPECIMEN JAR SMALL (MISCELLANEOUS) ×2 IMPLANT
SUT MNCRL AB 4-0 PS2 18 (SUTURE) ×2 IMPLANT
SUT MON AB 4-0 PC3 18 (SUTURE) ×2 IMPLANT
SUT VICRYL 0 UR6 27IN ABS (SUTURE) ×2 IMPLANT
TOWEL GREEN STERILE (TOWEL DISPOSABLE) ×2 IMPLANT
TOWEL GREEN STERILE FF (TOWEL DISPOSABLE) ×2 IMPLANT
TRAY LAPAROSCOPIC MC (CUSTOM PROCEDURE TRAY) ×2 IMPLANT
TROCAR XCEL BLUNT TIP 100MML (ENDOMECHANICALS) ×2 IMPLANT
TROCAR XCEL NON-BLD 5MMX100MML (ENDOMECHANICALS) ×2 IMPLANT
WATER STERILE IRR 1000ML POUR (IV SOLUTION) ×2 IMPLANT

## 2021-06-26 NOTE — Interval H&P Note (Signed)
History and Physical Interval Note:no change in H and P  06/26/2021 10:23 AM  Stephen Carey  has presented today for surgery, with the diagnosis of ABNORMAL APPENDIX.  The various methods of treatment have been discussed with the patient and family. After consideration of risks, benefits and other options for treatment, the patient has consented to  Procedure(s): LAPAROSCOPIC APPENDECTOMY (N/A) as a surgical intervention.  The patient's history has been reviewed, patient examined, no change in status, stable for surgery.  I have reviewed the patient's chart and labs.  Questions were answered to the patient's satisfaction.     Coralie Keens

## 2021-06-26 NOTE — Op Note (Signed)
LAPAROSCOPIC APPENDECTOMY  Procedure Note  Stephen Carey 06/26/2021   Pre-op Diagnosis: ABNORMAL APPENDIX     Post-op Diagnosis: same  Procedure(s): LAPAROSCOPIC APPENDECTOMY  Surgeon(s): Coralie Keens, MD  Anesthesia: General  Staff:  Circulator: Bartholomew Boards, RN Scrub Person: Lovett Sox, CST RN First Assistant: Philomena Doheny, RN  Estimated Blood Loss: Minimal               Specimens: sent to path  Indications: This is a 65 year old gentleman who presented to his gastroenterologist with over a week of right lower quadrant abdominal pain.  He underwent a CAT scan of the abdomen and pelvis which showed a dilated appendix but no other abnormalities.  He was placed on antibiotics and did improve.  The decision was made to proceed with an appendectomy  Findings: The distal two thirds of the appendix was chronically dilated.  The base was normal.  There were no other intra-abdominal abnormalities visualized.  Procedure: Patient brought to operating identifies correct patient.  He is placed upon the operating table and general anesthesia was induced.  His abdomen was prepped and draped in usual sterile fashion.  I made a vertical incision just below the umbilicus the previous scar with a scalpel.  I took this down to the fascia which was then opened with a scalpel toward a small umbilical hernia defect.  I then used a hemostat to gain entrance to the peritoneal cavity.  A 0 Vicryl pursing suture was placed around the fascial opening.  The [facility] port was placed the opening and insufflation of the abdomen was begun.  I placed a 5 mm trocar in the patient's right upper quadrant and another in the left lower quadrant under direct vision.  I inspected the abdominal cavity.  The liver and gallbladder appeared normal.  I could then identify the appendix in the right lower quadrant.  It appeared chronically distended the distal two thirds of the appendix.  There is no  evidence of perforation or necrosis.  I was able to take down the mesoappendix with the harmonic scalpel.  I then transected the base the appendix with a laparoscopic GIA stapler.  The appendix was then placed in an Endosac and removed through the incision at the umbilicus.  I then copiously irrigated the abdomen with normal saline.  Again hemostasis appeared to be achieved.  All ports were removed under direct vision the abdomen was deflated.  I tied the 0 Vicryl in place closing the fascial defect.  I placed another figure-of-eight 0 Vicryl suture at the umbilicus as well to reinforce the fascia at the previous hernia defect.  All incisions were then anesthetized with Marcaine and closed 4-0 Monocryl sutures.  Dermabond was then applied.  The patient tolerated the procedure well.  All the counts were correct at the end of the procedure.  The patient was then extubated in the operating room and taken in stable addition to the recovery room.          Coralie Keens   Date: 06/26/2021  Time: 11:32 AM

## 2021-06-26 NOTE — Anesthesia Preprocedure Evaluation (Signed)
Anesthesia Evaluation  Patient identified by MRN, date of birth, ID band Patient awake    Reviewed: Allergy & Precautions, H&P , NPO status , Patient's Chart, lab work & pertinent test results  Airway Mallampati: II   Neck ROM: limited    Dental   Pulmonary former smoker,    breath sounds clear to auscultation       Cardiovascular negative cardio ROS   Rhythm:regular Rate:Normal     Neuro/Psych Cervical spinal stenosis    GI/Hepatic PUD, GERD  ,  Endo/Other    Renal/GU      Musculoskeletal  (+) Arthritis ,   Abdominal   Peds  Hematology   Anesthesia Other Findings   Reproductive/Obstetrics                             Anesthesia Physical Anesthesia Plan  ASA: 2  Anesthesia Plan: General   Post-op Pain Management:    Induction: Intravenous  PONV Risk Score and Plan: 2 and Ondansetron, Dexamethasone, Midazolam and Treatment may vary due to age or medical condition  Airway Management Planned: Oral ETT and Video Laryngoscope Planned  Additional Equipment:   Intra-op Plan:   Post-operative Plan: Extubation in OR  Informed Consent: I have reviewed the patients History and Physical, chart, labs and discussed the procedure including the risks, benefits and alternatives for the proposed anesthesia with the patient or authorized representative who has indicated his/her understanding and acceptance.     Dental advisory given  Plan Discussed with: CRNA, Anesthesiologist and Surgeon  Anesthesia Plan Comments:         Anesthesia Quick Evaluation

## 2021-06-26 NOTE — Anesthesia Procedure Notes (Signed)
Procedure Name: Intubation Date/Time: 06/26/2021 10:51 AM Performed by: Eligha Bridegroom, CRNA Pre-anesthesia Checklist: Patient identified, Emergency Drugs available, Suction available, Patient being monitored and Timeout performed Oxygen Delivery Method: Circle system utilized Preoxygenation: Pre-oxygenation with 100% oxygen Induction Type: IV induction Laryngoscope Size: Mac and 4 Grade View: Grade I Tube type: Oral Number of attempts: 1 Airway Equipment and Method: Stylet Secured at: 23 cm Dental Injury: Teeth and Oropharynx as per pre-operative assessment

## 2021-06-26 NOTE — Discharge Instructions (Signed)
[facility] ______[facility] LAPAROSCOPIC SURGERY: POST OP INSTRUCTIONS Always review your discharge instruction sheet given to you by the facility where your surgery was performed. IF YOU HAVE DISABILITY OR FAMILY LEAVE FORMS, YOU MUST BRING THEM TO THE OFFICE FOR PROCESSING.   DO NOT GIVE THEM TO YOUR DOCTOR.  A prescription for pain medication may be given to you upon discharge.  Take your pain medication as prescribed, if needed.  If narcotic pain medicine is not needed, then you may take acetaminophen (Tylenol) or ibuprofen (Advil) as needed. Take your usually prescribed medications unless otherwise directed. If you need a refill on your pain medication, please contact your pharmacy.  They will contact our office to request authorization. Prescriptions will not be filled after 5pm or on week-ends. You should follow a light diet the first few days after arrival home, such as soup and crackers, etc.  Be sure to include lots of fluids daily. Most patients will experience some swelling and bruising in the area of the incisions.  Ice packs will help.  Swelling and bruising can take several days to resolve.  It is common to experience some constipation if taking pain medication after surgery.  Increasing fluid intake and taking a stool softener (such as Colace) will usually help or prevent this problem from occurring.  A mild laxative (Milk of Magnesia or Miralax) should be taken according to package instructions if there are no bowel movements after 48 hours. Unless discharge instructions indicate otherwise, you may remove your bandages 24-48 hours after surgery, and you may shower at that time.  You may have steri-strips (small skin tapes) in place directly over the incision.  These strips should be left on the skin for 7-10 days.  If your surgeon used skin glue on the incision, you may shower in 24 hours.  The glue will flake off over the next 2-3 weeks.  Any sutures or staples will be  removed at the office during your follow-up visit. ACTIVITIES:  You may resume regular (light) daily activities beginning the next day--such as daily self-care, walking, climbing stairs--gradually increasing activities as tolerated.  You may have sexual intercourse when it is comfortable.  Refrain from any heavy lifting or straining until approved by your doctor. You may drive when you are no longer taking prescription pain medication, you can comfortably wear a seatbelt, and you can safely maneuver your car and apply brakes. RETURN TO WORK:  __________________________________________________________ Dennis Bast should see your doctor in the office for a follow-up appointment approximately 2-3 weeks after your surgery.  Make sure that you call for this appointment within a day or two after you arrive home to insure a convenient appointment time. OTHER INSTRUCTIONS: OK TO SHOWER STARTING TOMORROW ICE PACK, TYLENOL, AND IBUPROFEN ALSO FOR PAIN NO LIFTING MORE THAN 15 TO 20 POUNDS FOR 2 WEEKS __________________________________________________________________________________________________________________________ __________________________________________________________________________________________________________________________ WHEN TO CALL YOUR DOCTOR: Fever over 101.0 Inability to urinate Continued bleeding from incision. Increased pain, redness, or drainage from the incision. Increasing abdominal pain  The clinic staff is available to answer your questions during regular business hours.  Please don't hesitate to call and ask to speak to one of the nurses for clinical concerns.  If you have a medical emergency, go to the nearest emergency room or call 911.  A surgeon from [facility] is always on call at the hospital. [address] [place], [place], [place]  [address] ? [address] [place], [place],    [address] [phone] ?  [phone] ? FAX [phone] Web site:  [email or web address]

## 2021-06-26 NOTE — Transfer of Care (Signed)
Immediate Anesthesia Transfer of Care Note  Patient: Stephen Carey  Procedure(s) Performed: LAPAROSCOPIC APPENDECTOMY (Abdomen)  Patient Location: PACU  Anesthesia Type:General  Level of Consciousness: awake, alert  and oriented  Airway & Oxygen Therapy: Patient Spontanous Breathing and Patient connected to nasal cannula oxygen  Post-op Assessment: Report given to RN and Post -op Vital signs reviewed and stable  Post vital signs: Reviewed and stable  Last Vitals:  Vitals Value Taken Time  BP 173/104 06/26/21 1152  Temp    Pulse 76 06/26/21 1153  Resp 16 06/26/21 1153  SpO2 97 % 06/26/21 1153  Vitals shown include unvalidated device data.  Last Pain:  Vitals:   06/26/21 0956  TempSrc:   PainSc: 0-No pain      Patients Stated Pain Goal: 2 (99/41/29 0475)  Complications: No notable events documented.

## 2021-06-27 ENCOUNTER — Encounter (HOSPITAL_COMMUNITY): Payer: Self-pay | Admitting: Surgery

## 2021-06-27 NOTE — Anesthesia Postprocedure Evaluation (Signed)
Anesthesia Post Note  Patient: Stephen Carey  Procedure(s) Performed: LAPAROSCOPIC APPENDECTOMY (Abdomen)     Patient location during evaluation: PACU Anesthesia Type: General Level of consciousness: awake and alert Pain management: pain level controlled Vital Signs Assessment: post-procedure vital signs reviewed and stable Respiratory status: spontaneous breathing, nonlabored ventilation, respiratory function stable and patient connected to nasal cannula oxygen Cardiovascular status: blood pressure returned to baseline and stable Postop Assessment: no apparent nausea or vomiting Anesthetic complications: no   No notable events documented.  Last Vitals:  Vitals:   06/26/21 1210 06/26/21 1225  BP: (!) 149/75 (!) 161/83  Pulse: 63 67  Resp: 12 15  Temp: 36.6 C   SpO2: 98% 98%    Last Pain:  Vitals:   06/26/21 1152  TempSrc:   PainSc: 5                  Averey Trompeter S

## 2021-07-02 DIAGNOSIS — M7989 Other specified soft tissue disorders: Secondary | ICD-10-CM | POA: Diagnosis not present

## 2021-07-02 DIAGNOSIS — I83812 Varicose veins of left lower extremities with pain: Secondary | ICD-10-CM | POA: Diagnosis not present

## 2021-07-02 LAB — SURGICAL PATHOLOGY

## 2021-07-02 NOTE — Addendum Note (Signed)
Addendum  created 07/02/21 0813 by Albertha Ghee, MD   Intraprocedure Staff edited

## 2021-07-08 DIAGNOSIS — I8002 Phlebitis and thrombophlebitis of superficial vessels of left lower extremity: Secondary | ICD-10-CM | POA: Diagnosis not present

## 2021-07-08 DIAGNOSIS — M7989 Other specified soft tissue disorders: Secondary | ICD-10-CM | POA: Diagnosis not present

## 2021-07-08 DIAGNOSIS — I8312 Varicose veins of left lower extremity with inflammation: Secondary | ICD-10-CM | POA: Diagnosis not present

## 2021-07-08 DIAGNOSIS — I83813 Varicose veins of bilateral lower extremities with pain: Secondary | ICD-10-CM | POA: Diagnosis not present

## 2021-07-09 ENCOUNTER — Ambulatory Visit: Payer: Self-pay | Admitting: General Surgery

## 2021-07-09 DIAGNOSIS — C181 Malignant neoplasm of appendix: Secondary | ICD-10-CM | POA: Diagnosis not present

## 2021-07-09 MED ORDER — GENTAMICIN SULFATE 40 MG/ML IJ SOLN: 5.0000 mg/kg | INTRAVENOUS | Status: AC

## 2021-07-09 MED ORDER — DEXTROSE 5 % IV SOLN: 900.0000 mg | INTRAVENOUS | Status: AC

## 2021-07-09 NOTE — H&P (Signed)
PROVIDER:  Monico Blitz, MD  MRN: [id number] DOB: 1956-05-11 DATE OF ENCOUNTER: 07/09/2021  Subjective   Chief Complaint: Colon Cancer     History of Present Illness: Stephen Carey is a 65 y.o. male who was seen an office consultation at the request of Dr. Laurann Montana for evaluation of abd pain and abnormal CT scan.  He was noted to have a dilated appendix and therefore underwent laparoscopic appendectomy by Dr. Ninfa Linden.  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  He is here today to discuss further treatment.  He does have a family history of a father with GI cancer.   Review of Systems: A complete review of systems was obtained from the patient.  I have reviewed this information and discussed as appropriate with the patient.  See HPI as well for other ROS.    Medical History: Past Medical History:  Diagnosis Date   Arthritis    GERD (gastroesophageal reflux disease)    Hyperlipidemia     Patient Active Problem List  Diagnosis   Benign prostatic hyperplasia without lower urinary tract symptoms   Body mass index (BMI) 33.0-33.9, adult   Obesity   Cervical spinal stenosis   Cervical spondylosis   Chronic bilateral low back pain without sciatica   Chronic pain syndrome   Generalized osteoarthritis   Left hip pain   Mixed hyperlipidemia   Numbness and tingling   Osteoarthritis of finger of right hand   Upper abdominal pain    Past Surgical History:  Procedure Laterality Date   APPENDECTOMY     Cataract Surgery      Elbow Surgery  Bilateral    Foot Surgery      Hand Surgery      HERNIA REPAIR     Knee Surgery  Bilateral    Shoulder Surgery        Allergies  Allergen Reactions   Penicillins Other (See Comments), Rash and Unknown    REACTION: as child REACTION: as child Unknown, childhood    Atorvastatin Other (See Comments)    myalgias    Oxycodone-Acetaminophen Other (See Comments)    GI upset    Doxycycline  Diarrhea, Other (See Comments) and Rash    REACTION: severe diarrhea REACTION: severe diarrhea REACTION: severe diarrhea    Sulfa (Sulfonamide Antibiotics) Hives and Rash    REACTION: rash, nausea     Current Outpatient Medications on File Prior to Visit  Medication Sig Dispense Refill   aspirin 81 MG EC tablet Take 1 tablet (81 mg total) by mouth once daily     fluticasone propionate (FLONASE) 50 mcg/actuation nasal spray 1 spray in each nostril     gabapentin (NEURONTIN) 100 MG capsule 3 capsule     gabapentin (NEURONTIN) 300 MG capsule 1 capsule     omeprazole (PRILOSEC) 20 MG DR capsule Take 1 capsule (20 mg total) by mouth once daily     peppermint oiL (IBGARD) 90 mg CECX See admin instructions.     predniSONE (DELTASONE) 20 MG tablet 2 tablets     rosuvastatin (CRESTOR) 10 MG tablet Take 1 tablet (10 mg total) by mouth once daily     sildenafil (REVATIO) 20 mg tablet 2-5 tablet     traMADoL (ULTRAM) 50 mg tablet 2 tablets     No current facility-administered medications on file prior to visit.    History reviewed. No pertinent family history.   Social History   Tobacco Use  Smoking Status Never  Smokeless Tobacco  Never     Social History   Socioeconomic History   Marital status: Married  Tobacco Use   Smoking status: Never   Smokeless tobacco: Never  Vaping Use   Vaping Use: Never used  Substance and Sexual Activity   Alcohol use: Never   Drug use: Never    Objective:    Exam Gen: NAD CV: RRR Lungs: CTA Abd: soft, incisions healing well    Labs, Imaging and Diagnostic Testing: APPENDIX, CARCINOMA: Resection   Procedure: Appendectomy  Tumor Size: See comment  Histologic Type: Goblet cell adenocarcinoma  Histologic Grade: Grade 3  Tumor Deposits: Not identified  Tumor Extension: Acellular mucin invades visceral peritoneum  Lymphovascular Invasion: Not readily identified  Perineural invasion: Present  Margins:       Margin Status for Invasive  Carcinoma: Proximal margin involved by  invasive carcinoma  Regional Lymph Nodes: Not applicable (no lymph nodes submitted or found)       Distant Metastasis:  Not applicable  Pathologic Stage Classification (pTNM, AJCC 8th Edition): pT4a, pN not    CT A/P IMPRESSION: 1. Newly dilated and mildly thick walled appendix without significant periappendiceal inflammatory changes. Findings are equivocal for acute appendicitis. No free air. No abscess. Consider surgical consultation at this time as clinically warranted. 2. Mild sigmoid diverticulosis, with no evidence of acute diverticulitis. 3. Evolving mild postinfectious/postinflammatory scarring or atelectasis in the medial basilar right lower lung lobe. 4. Aortic Atherosclerosis (ICD10-I70.0).  Assessment and Plan:  Diagnoses and all orders for this visit:  Mucinous adenocarcinoma of appendix (CMS-HCC) -     Carcinoembryonic Antigen (CEA)  Other orders -     polyethylene glycol (MIRALAX) powder; Take 233.75 g by mouth once for 1 dose Take according to your procedure prep instructions. -     bisacodyL (DULCOLAX) 5 mg EC tablet; Take 4 tablets (20 mg total) by mouth once daily as needed for Constipation for up to 1 dose -     metroNIDAZOLE (FLAGYL) 500 MG tablet; Take 2 tablets (1,000 mg total) by mouth 3 (three) times daily for 3 doses Take according to your procedure colon prep instructions -     neomycin 500 mg tablet; Take 2 tablets (1,000 mg total) by mouth 3 (three) times daily for 3 doses Take according to your procedure colon prep instructions    Patient has adenocarcinoma of the appendix with positive margins.  We will proceed with completing his metastatic work-up including CT chest and CEA level.  I have recommended robotic assisted right colectomy.  We have discussed the surgery in detail.  All questions were answered.  The surgery and anatomy were described to the patient as well as the risks of surgery and the possible  complications.  These include: Bleeding, deep abdominal infections and possible wound complications such as hernia and infection, damage to adjacent structures, leak of surgical connections, which can lead to other surgeries and possibly an ostomy, possible need for other procedures, such as abscess drains in radiology, possible prolonged hospital stay, possible diarrhea from removal of part of the colon, possible constipation from narcotics, possible bowel, bladder or sexual dysfunction if having rectal surgery, prolonged fatigue/weakness or appetite loss, possible early recurrence of of disease, possible complications of their medical problems such as heart disease or arrhythmias or lung problems, death (less than 1%). I believe the patient understands and wishes to proceed with the surgery.

## 2021-07-11 ENCOUNTER — Telehealth: Payer: Self-pay | Admitting: Nurse Practitioner

## 2021-07-11 ENCOUNTER — Other Ambulatory Visit: Payer: Self-pay | Admitting: General Surgery

## 2021-07-11 DIAGNOSIS — C801 Malignant (primary) neoplasm, unspecified: Secondary | ICD-10-CM

## 2021-07-11 DIAGNOSIS — Z9049 Acquired absence of other specified parts of digestive tract: Secondary | ICD-10-CM

## 2021-07-11 NOTE — Telephone Encounter (Signed)
Scheduled appt per 12/13 referral. Pt is aware of appt date and time.

## 2021-07-12 ENCOUNTER — Other Ambulatory Visit: Payer: Self-pay | Admitting: General Surgery

## 2021-07-12 ENCOUNTER — Encounter: Payer: Self-pay | Admitting: *Deleted

## 2021-07-12 ENCOUNTER — Other Ambulatory Visit: Payer: Self-pay | Admitting: *Deleted

## 2021-07-12 DIAGNOSIS — C181 Malignant neoplasm of appendix: Secondary | ICD-10-CM

## 2021-07-12 DIAGNOSIS — C801 Malignant (primary) neoplasm, unspecified: Secondary | ICD-10-CM

## 2021-07-12 DIAGNOSIS — Z9049 Acquired absence of other specified parts of digestive tract: Secondary | ICD-10-CM

## 2021-07-12 NOTE — Progress Notes (Signed)
Spoke with patient after receiving referral from [facility] Surgery this am.  Discussed referral and New Pt appt for next Friday 12/13 with Dr Benay Spice. Answered questions regarding referral and what to expect with first appt. Directions and parking discussed as well as mask requirement and allowing one support person for visit.  Pt was also given contact information and direct number to Oncology Navigator

## 2021-07-15 ENCOUNTER — Other Ambulatory Visit: Payer: Self-pay

## 2021-07-15 ENCOUNTER — Ambulatory Visit (HOSPITAL_COMMUNITY)
Admission: RE | Admit: 2021-07-15 | Discharge: 2021-07-15 | Disposition: A | Discharge status: Home / Self Care | Payer: Medicare HMO | Source: Ambulatory Visit | Attending: General Surgery | Admitting: General Surgery

## 2021-07-15 DIAGNOSIS — C801 Malignant (primary) neoplasm, unspecified: Secondary | ICD-10-CM | POA: Insufficient documentation

## 2021-07-15 DIAGNOSIS — Z9049 Acquired absence of other specified parts of digestive tract: Secondary | ICD-10-CM | POA: Insufficient documentation

## 2021-07-15 DIAGNOSIS — R911 Solitary pulmonary nodule: Secondary | ICD-10-CM | POA: Diagnosis not present

## 2021-07-15 LAB — POCT I-STAT CREATININE: Creatinine, Ser: 1.2 mg/dL (ref 0.61–1.24)

## 2021-07-15 MED ORDER — IOHEXOL 350 MG/ML SOLN
75.0000 mL | Freq: Once | INTRAVENOUS | Status: AC | PRN
  Administered 2021-07-15: 17:00:00 75 mL via INTRAVENOUS

## 2021-07-15 MED ORDER — SODIUM CHLORIDE (PF) 0.9 % IJ SOLN
INTRAMUSCULAR | Status: AC
  Filled 2021-07-15: qty 50

## 2021-07-19 ENCOUNTER — Inpatient Hospital Stay: Payer: Medicare HMO | Attending: Oncology | Admitting: Oncology

## 2021-07-19 ENCOUNTER — Encounter: Payer: Self-pay | Admitting: *Deleted

## 2021-07-19 ENCOUNTER — Other Ambulatory Visit: Payer: Self-pay

## 2021-07-19 VITALS — BP 170/81 | HR 78 | Temp 97.8°F | Resp 18 | Ht 72.0 in | Wt 227.6 lb

## 2021-07-19 DIAGNOSIS — C181 Malignant neoplasm of appendix: Secondary | ICD-10-CM

## 2021-07-19 NOTE — Progress Notes (Signed)
[facility] Patient Consult   Requesting MD: Lavone Orn, Md [address] [place],  [place] [address]   Kathee Delton 65 y.o.  Jul 13, 1956    Reason for Consult: Goblet cell adenocarcinoma of the appendix   HPI: Mr. Wakefield has a history of peptic ulcer disease.  He saw Dr. Paulita Fujita for abdominal discomfort.  His symptoms persisted for several months and a CT of the abdomen and pelvis was obtained on 06/03/2021.  Evolving post inflammatory changes were noted in the right lower lobe.  A newly dilated and mildly thick-walled appendix withou periappendiceal inflammation was noted.  No pathologically enlarged lymph nodes in the abdomen or pelvis.  No ascites or focal fluid collection.  He was referred to Dr. Ninfa Linden and was taken for laparoscopic appendectomy 06/26/2021.  The distal two thirds of the appendix appeared chronically dilated.  The appendix base appeared normal.  No other intra-abdominal abnormality. The pathology revealed an invasive goblet cell adenocarcinoma, grade 3, with acellular mucin invading the visceral peritoneum.  The proximal margin was involved by invasive carcinoma.  Perineural invasion is present.  No lymph nodes were submitted.  The tumor was estimated to measure 2 x 1.2 x 1 cm.  He was referred to Dr. Marcello Moores.  A CT of the chest 07/15/2021.  A new inflammatory appearing right lower lobe nodule was noted.  There is a 4 mm right middle lobe perifissural nodule.  No suspicious pulmonary nodules.  He is scheduled for a right colectomy 08/14/2021.  He feels well at present.  He is referred for oncology consultation.  He reports being diagnosed with superficial phlebitis at the left lower leg approximately 1 week following the appendectomy.  He was seen at a vein clinic.  He describes undergoing a Doppler evaluation and was treated with low-dose aspirin.  The area of phlebitis has improved.    Past Medical History:  Diagnosis Date    BPH (benign prostatic hyperplasia)    BPH (benign prostatic hyperplasia)    Cervical spinal stenosis    Dr Ellene Route   Chronic pain disorder    Diverticulosis    DJD (degenerative joint disease)    multiple sites   GERD (gastroesophageal reflux disease)    Hyperlipidemia    Inguinal hernia    bilateral inguinal herniorrhaphy Dr Kaylyn Lim   Osteoarthritis    generalized   PUD (peptic ulcer disease)    [place]aid related 2013, [place]    Past Surgical History:  Procedure Laterality Date   CATARACT EXTRACTION Bilateral 10/2020   CYST EXCISION Right 10/03/2019   Procedure: EXCISION MUCOID CYST WITH INTERPHALAGEAL JOINT ARTHROTOMY RIGHT THUMB;  Surgeon: Leanora Cover, MD;  Location: [facility];  Service: Orthopedics;  Laterality: Right;  Bier block   ELBOW SURGERY Bilateral    bursectomy   ESOPHAGOGASTRODUODENOSCOPY     FOOT SURGERY Right    HAND SURGERY Left    HERNIA REPAIR Bilateral    inguinal   KNEE ARTHROSCOPY Right    KNEE SURGERY Left    [place]   LAPAROSCOPIC APPENDECTOMY N/A 06/26/2021   Procedure: LAPAROSCOPIC APPENDECTOMY;  Surgeon: Coralie Keens, MD;  Location: [facility];  Service: General;  Laterality: N/A;   ROTATOR CUFF REPAIR Left    left, Dr Percell Miller   SHOULDER ARTHROSCOPY Left    VASECTOMY     Dr Risa Grill    Medications: Reviewed  Allergies:  Allergies  Allergen Reactions   Sulfa Antibiotics Hives    rash  Atorvastatin     myalgias   Doxycycline     REACTION: severe diarrhea   Penicillins     REACTION: as child   Percocet [Oxycodone-Acetaminophen]     GI upset   Sulfonamide Derivatives     REACTION: rash, nausea    Family history: His father died of pancreas cancer at age 57.  His paternal grandfather had "brain "cancer  Social History:   He lives with his wife and son in [place].  He works as a Art gallery manager.  He does not use cigarettes or alcohol.  No transfusion history.  No risk factor for HIV or hepatitis.  ROS:   Positives include:  Mild abdominal discomfort prior to the appendectomy, left leg superficial phlebitis approximately 1 week following the appendectomy surgery, change in bowel habits with intermittent "loose "stool prior to the appendectomy  A complete ROS was otherwise negative.  Physical Exam:  Blood pressure (!) 170/81, pulse 78, temperature 97.8 F (36.6 C), temperature source Oral, resp. rate 18, height 6' (1.829 m), weight 227 lb 9.6 oz (103.2 kg), SpO2 100 %.  HEENT: Neck without mass Lungs: Clear bilaterally Cardiac: Regular rate and rhythm Abdomen: Healed surgical incisions, no mass, no hepatosplenomegaly  Vascular: No leg edema, thrombosed superficial varicosity at the distal medial left thigh without tenderness or surrounding erythema Lymph nodes: No cervical, supraclavicular, axillary, or inguinal nodes Neurologic: Alert and oriented, motor exam appears intact in the upper and lower extremities bilaterally Skin: No rash Musculoskeletal: No spine tenderness   LAB:  CBC  Lab Results  Component Value Date   WBC 6.9 06/17/2021   HGB 15.9 06/17/2021   HCT 49.1 06/17/2021   MCV 97.6 06/17/2021   PLT 186 06/17/2021        CMP  Lab Results  Component Value Date   CREATININE 1.20 07/15/2021    Imaging: CT images 06/03/2021-reviewed    Assessment/Plan:   Goblet cell adenocarcinoma the appendix Appendectomy 06/26/2021, 2 x 1.2 x 1 cm grade 3 goblet cell adenocarcinoma, acellular mucin invades the visceral peritoneum (pT4a), perineural invasion present, proximal margin involved by invasive carcinoma CT abdomen/pelvis 06/03/2021-newly dilated and mildly thick-walled appendix without significant periappendiceal inflammatory changes, mild sigmoid diverticulosis, evolving postinfectious/inflammatory change in the medial basilar right lower lobe CT chest 07/15/2021-no evidence of metastatic disease, small nodules at the right lung base-1 appears inflammatory, one 4 mm perifissural  nodule History of peptic ulcer disease BPH Bilateral inguinal hernia repair Left leg superficial phlebitis November 2022 COVID-15 April 2021   Disposition:   Mr. Lehnen has been diagnosed with goblet cell adenocarcinoma the appendix.  I discussed the diagnosis and treatment plan with Mr. Obryan and his wife.  He is scheduled for a right colectomy 08/14/2020.  I explained the goblet cell adenocarcinomas are managed similar to patients with adenocarcinoma the colon.  We will wait on the final surgery pathology to determine the pathologic stage and indication for adjuvant systemic therapy.   I will see him approximately 1 week following surgery to review the surgical pathology report and decide on the indication for adjuvant therapy.  He plans to obtain an influenza and COVID-19 vaccine prior to surgery.  Betsy Coder, MD  07/19/2021, 2:51 PM

## 2021-07-19 NOTE — Progress Notes (Signed)
PATIENT NAVIGATOR PROGRESS NOTE  Name: Stephen Carey Date: 07/19/2021 MRN: [id number]  DOB: [birth year]   Reason for visit:  Initial med/onc visit  Comments:  Met with Mr and Mrs Viveros during visit with Dr Benay Spice. He is having surgery on 1/18, will follow after surgery and F/U visit with Dr Benay Spice on 1/25 to discuss pathology. ' We discussed anxiety management, he loves to play golf and finds that helps.  Coordination of care to include F/U after surgery.  No other barriers to care identified Given contact information and encouraged to call with any issues or questions    Time spent counseling/coordinating care: > 60 minutes

## 2021-07-25 ENCOUNTER — Other Ambulatory Visit (HOSPITAL_COMMUNITY): Payer: Self-pay

## 2021-07-31 ENCOUNTER — Ambulatory Visit: Payer: Medicare HMO | Admitting: Nurse Practitioner

## 2021-08-02 NOTE — Patient Instructions (Signed)
DUE TO COVID-19 ONLY ONE VISITOR IS ALLOWED TO COME WITH YOU AND STAY IN THE WAITING ROOM ONLY DURING PRE OP AND PROCEDURE.   **NO VISITORS ARE ALLOWED IN THE SHORT STAY AREA OR RECOVERY ROOM!!**  IF YOU WILL BE ADMITTED INTO THE HOSPITAL YOU ARE ALLOWED ONLY TWO SUPPORT PEOPLE DURING VISITATION HOURS ONLY (7 AM -8PM)   The support person(s) must pass our screening, gel in and out, and wear a mask at all times, including in the patients room. Patients must also wear a mask when staff or their support person are in the room. Visitors GUEST BADGE MUST BE WORN VISIBLY  One adult visitor may remain with you overnight and MUST be in the room by 8 P.M.  No visitors under the age of 51. Any visitor under the age of 27 must be accompanied by an adult.    COVID SWAB TESTING MUST BE COMPLETED ON:  08/12/21 @ 9:15 AM   Site: [facility] [place] [place] Enter: Main Entrance have a seat in the waiting area to the right of main entrance (DO NOT [place]!!!!!) Dial: [phone] to alert staff you have arrived  You are not required to quarantine, however you are required to wear a well-fitted mask when you are out and around people not in your household.  Hand Hygiene often Do NOT share personal items Notify your provider if you are in close contact with someone who has COVID or you develop fever 100.4 or greater, new onset of sneezing, cough, sore throat, shortness of breath or body aches.  [place], [address], must go inside of the hospital, NOT A DRIVE THRU!  (Must self quarantine after testing. Follow instructions on handout.)       Your procedure is scheduled on: 08/14/21   Report to [facility] Main Entrance    Report to admitting at : 10:45 AM   Call this number if you have problems the morning of surgery [phone]  CLEAR LIQUID DIET: STARTING THE DAY BEFORE SURGERY UNTIL:  10:00 AM.  Foods Allowed                                                                     Foods Excluded  Water, Black Coffee and tea, regular and decaf                             liquids that you cannot  Plain Jell-O in any flavor  (No red)                                           see through such as: Fruit ices (not with fruit pulp)                                     milk, soups, orange juice              Iced Popsicles (No red)  All solid food                                   Apple juices Sports drinks like Gatorade (No red) Lightly seasoned clear broth or consume(fat free) Sugar Sample Menu Breakfast                                Lunch                                     Supper Cranberry juice                    Beef broth                            Chicken broth Jell-O                                     Grape juice                           Apple juice Coffee or tea                        Jell-O                                      Popsicle                                                Coffee or tea                        Coffee or tea   DRINK 2 PRESURGERY ENSURE DRINKS THE NIGHT BEFORE SURGERY AT  1000 PM AND 1 PRESURGERY DRINK THE DAY OF THE PROCEDURE 3 HOURS PRIOR TO SCHEDULED SURGERY. NO SOLIDS AFTER MIDNIGHT THE DAY PRIOR TO THE SURGERY. NOTHING BY MOUTH EXCEPT CLEAR LIQUIDS UNTIL THREE HOURS PRIOR TO SCHEDULED SURGERY. PLEASE FINISH PRESURGERY ENSURE DRINK PER SURGEON ORDER 3 HOURS PRIOR TO SCHEDULED SURGERY TIME WHICH NEEDS TO BE COMPLETED AT: 10:00 AM.    The day of surgery:  Drink ONE (1) Pre-Surgery Clear Ensure or G2 by am the morning of surgery. Drink in one sitting. Do not sip.  This drink was given to you during your hospital  pre-op appointment visit. Nothing else to drink after completing the  Pre-Surgery Clear Ensure or G2.          If you have questions, please contact your surgeons office. Oral Hygiene is also important  to reduce your risk of infection.                                    Remember - BRUSH YOUR TEETH THE MORNING OF SURGERY WITH YOUR REGULAR TOOTHPASTE   Do NOT smoke  after Midnight   Take these medicines the morning of surgery with A SIP OF WATER: loratadine.Use Flonase as usual  DO NOT TAKE ANY ORAL DIABETIC MEDICATIONS DAY OF YOUR SURGERY                              You may not have any metal on your body including hair pins, jewelry, and body piercing             Do not wear lotions, powders, perfumes/cologne, or deodorant             Men may shave face and neck.   Do not bring valuables to the hospital. [place].   Contacts, dentures or bridgework may not be worn into surgery.   Bring small overnight bag day of surgery.    Patients discharged on the day of surgery will not be allowed to drive home.  We recommend you have a responsible adult to stay with you for the first 24 hours after anesthesia.   Special Instructions: Bring a copy of your healthcare power of attorney and living will documents         the day of surgery if you haven't scanned them before.              Please read over the following fact sheets you were given: IF YOU HAVE QUESTIONS ABOUT YOUR PRE-OP INSTRUCTIONS PLEASE CALL [phone]     [facility] - Preparing for Surgery Before surgery, you can play an important role.  Because skin is not sterile, your skin needs to be as free of germs as possible.  You can reduce the number of germs on your skin by washing with CHG (chlorahexidine gluconate) soap before surgery.  CHG is an antiseptic cleaner which kills germs and bonds with the skin to continue killing germs even after washing. Please DO NOT use if you have an allergy to CHG or antibacterial soaps.  If your skin becomes reddened/irritated stop using the CHG and inform your nurse when you arrive at Short Stay. Do not shave (including legs and underarms) for at  least 48 hours prior to the first CHG shower.  You may shave your face/neck. Please follow these instructions carefully:  1.  Shower with CHG Soap the night before surgery and the  morning of Surgery.  2.  If you choose to wash your hair, wash your hair first as usual with your  normal  shampoo.  3.  After you shampoo, rinse your hair and body thoroughly to remove the  shampoo.                           4.  Use CHG as you would any other liquid soap.  You can apply chg directly  to the skin and wash                       Gently with a scrungie or clean washcloth.  5.  Apply the CHG Soap to your body ONLY FROM THE NECK DOWN.   Do not use on face/ open                           Wound or open sores. Avoid contact  with eyes, ears mouth and genitals (private parts).                       Wash face,  Genitals (private parts) with your normal soap.             6.  Wash thoroughly, paying special attention to the area where your surgery  will be performed.  7.  Thoroughly rinse your body with warm water from the neck down.  8.  DO NOT shower/wash with your normal soap after using and rinsing off  the CHG Soap.                9.  Pat yourself dry with a clean towel.            10.  Wear clean pajamas.            11.  Place clean sheets on your bed the night of your first shower and do not  sleep with pets. Day of Surgery : Do not apply any lotions/deodorants the morning of surgery.  Please wear clean clothes to the hospital/surgery center.  FAILURE TO FOLLOW THESE INSTRUCTIONS MAY RESULT IN THE CANCELLATION OF YOUR SURGERY PATIENT SIGNATURE_________________________________  NURSE SIGNATURE__________________________________  ________________________________________________________________________   Stephen Carey  An incentive spirometer is a tool that can help keep your lungs clear and active. This tool measures how well you are filling your lungs with each breath. Taking long deep breaths  may help reverse or decrease the chance of developing breathing (pulmonary) problems (especially infection) following: A long period of time when you are unable to move or be active. BEFORE THE PROCEDURE  If the spirometer includes an indicator to show your best effort, your nurse or respiratory therapist will set it to a desired goal. If possible, sit up straight or lean slightly forward. Try not to slouch. Hold the incentive spirometer in an upright position. INSTRUCTIONS FOR USE  Sit on the edge of your bed if possible, or sit up as far as you can in bed or on a chair. Hold the incentive spirometer in an upright position. Breathe out normally. Place the mouthpiece in your mouth and seal your lips tightly around it. Breathe in slowly and as deeply as possible, raising the piston or the ball toward the top of the column. Hold your breath for 3-5 seconds or for as long as possible. Allow the piston or ball to fall to the bottom of the column. Remove the mouthpiece from your mouth and breathe out normally. Rest for a few seconds and repeat Steps 1 through 7 at least 10 times every 1-2 hours when you are awake. Take your time and take a few normal breaths between deep breaths. The spirometer may include an indicator to show your best effort. Use the indicator as a goal to work toward during each repetition. After each set of 10 deep breaths, practice coughing to be sure your lungs are clear. If you have an incision (the cut made at the time of surgery), support your incision when coughing by placing a pillow or rolled up towels firmly against it. Once you are able to get out of bed, walk around indoors and cough well. You may stop using the incentive spirometer when instructed by your caregiver.  RISKS AND COMPLICATIONS Take your time so you do not get dizzy or light-headed. If you are in pain, you may need to take or ask for pain medication before doing incentive  spirometry. It is harder to take a  deep breath if you are having pain. AFTER USE Rest and breathe slowly and easily. It can be helpful to keep track of a log of your progress. Your caregiver can provide you with a simple table to help with this. If you are using the spirometer at home, follow these instructions: [place] IF:  You are having difficultly using the spirometer. You have trouble using the spirometer as often as instructed. Your pain medication is not giving enough relief while using the spirometer. You develop fever of 100.5 F (38.1 C) or higher. SEEK IMMEDIATE MEDICAL CARE IF:  You cough up bloody sputum that had not been present before. You develop fever of 102 F (38.9 C) or greater. You develop worsening pain at or near the incision site. MAKE SURE YOU:  Understand these instructions. Will watch your condition. Will get help right away if you are not doing well or get worse. Document Released: 11/24/2006 Document Revised: 10/06/2011 Document Reviewed: 01/25/2007 [facility] Patient Information 2014 [place], [place].   ________________________________________________________________________

## 2021-08-05 ENCOUNTER — Encounter (HOSPITAL_COMMUNITY): Payer: Self-pay

## 2021-08-05 ENCOUNTER — Other Ambulatory Visit: Payer: Self-pay

## 2021-08-05 ENCOUNTER — Encounter (HOSPITAL_COMMUNITY)
Admission: RE | Admit: 2021-08-05 | Discharge: 2021-08-05 | Disposition: A | Discharge status: Home / Self Care | Payer: Medicare HMO | Source: Ambulatory Visit | Attending: General Surgery | Admitting: General Surgery

## 2021-08-05 VITALS — BP 158/82 | HR 62 | Temp 97.7°F | Ht 72.0 in | Wt 228.0 lb

## 2021-08-05 DIAGNOSIS — Z01818 Encounter for other preprocedural examination: Secondary | ICD-10-CM

## 2021-08-05 DIAGNOSIS — Z01812 Encounter for preprocedural laboratory examination: Secondary | ICD-10-CM | POA: Insufficient documentation

## 2021-08-05 DIAGNOSIS — C181 Malignant neoplasm of appendix: Secondary | ICD-10-CM | POA: Insufficient documentation

## 2021-08-05 HISTORY — DX: Malignant (primary) neoplasm, unspecified: C80.1

## 2021-08-05 LAB — CBC
HCT: 49.6 % (ref 39.0–52.0)
Hemoglobin: 16.2 g/dL (ref 13.0–17.0)
MCH: 32.1 pg (ref 26.0–34.0)
MCHC: 32.7 g/dL (ref 30.0–36.0)
MCV: 98.2 fL (ref 80.0–100.0)
Platelets: 175 10*3/uL (ref 150–400)
RBC: 5.05 MIL/uL (ref 4.22–5.81)
RDW: 12.5 % (ref 11.5–15.5)
WBC: 6.3 10*3/uL (ref 4.0–10.5)
nRBC: 0 % (ref 0.0–0.2)

## 2021-08-05 NOTE — Progress Notes (Addendum)
COVID Vaccine Completed: Yes Date COVID Vaccine completed:07/2021 x 4 COVID vaccine manufacturer:   Moderna    COVID Test: 08/12/21 PCP - Dr. Lavone Orn Cardiologist -   Chest x-ray - CT Chest: 07/15/21 EKG -  Stress Test -  ECHO -  Cardiac Cath -  Pacemaker/ICD device last checked:  Sleep Study -  CPAP -   Fasting Blood Sugar -  Checks Blood Sugar _____ times a day  Blood Thinner Instructions: Aspirin Instructions: Last Dose:  Anesthesia review:   Patient denies shortness of breath, fever, cough and chest pain at PAT appointment   Patient verbalized understanding of instructions that were given to them at the PAT appointment. Patient was also instructed that they will need to review over the PAT instructions again at home before surgery.

## 2021-08-06 DIAGNOSIS — R42 Dizziness and giddiness: Secondary | ICD-10-CM | POA: Diagnosis not present

## 2021-08-12 ENCOUNTER — Encounter (HOSPITAL_COMMUNITY)
Admission: RE | Admit: 2021-08-12 | Discharge: 2021-08-12 | Disposition: A | Discharge status: Home / Self Care | Payer: Medicare HMO | Source: Ambulatory Visit | Attending: General Surgery | Admitting: General Surgery

## 2021-08-12 ENCOUNTER — Other Ambulatory Visit: Payer: Self-pay

## 2021-08-12 DIAGNOSIS — Z01818 Encounter for other preprocedural examination: Secondary | ICD-10-CM

## 2021-08-12 DIAGNOSIS — Z20822 Contact with and (suspected) exposure to covid-19: Secondary | ICD-10-CM | POA: Insufficient documentation

## 2021-08-12 DIAGNOSIS — Z01812 Encounter for preprocedural laboratory examination: Secondary | ICD-10-CM | POA: Insufficient documentation

## 2021-08-13 LAB — SARS CORONAVIRUS 2 (TAT 6-24 HRS): SARS Coronavirus 2: NEGATIVE

## 2021-08-14 ENCOUNTER — Encounter (HOSPITAL_COMMUNITY)
Admission: RE | Disposition: A | Discharge status: Home / Self Care | Payer: Self-pay | Source: Ambulatory Visit | Attending: General Surgery

## 2021-08-14 ENCOUNTER — Inpatient Hospital Stay (HOSPITAL_COMMUNITY): Payer: Medicare HMO | Admitting: Certified Registered"

## 2021-08-14 ENCOUNTER — Encounter (HOSPITAL_COMMUNITY): Payer: Self-pay | Admitting: General Surgery

## 2021-08-14 ENCOUNTER — Inpatient Hospital Stay (HOSPITAL_COMMUNITY)
Admission: RE | Admit: 2021-08-14 | Discharge: 2021-08-16 | DRG: 331 | Disposition: A | Discharge status: Home / Self Care | Payer: Medicare HMO | Attending: General Surgery | Admitting: General Surgery

## 2021-08-14 ENCOUNTER — Other Ambulatory Visit: Payer: Self-pay

## 2021-08-14 DIAGNOSIS — Z6833 Body mass index (BMI) 33.0-33.9, adult: Secondary | ICD-10-CM

## 2021-08-14 DIAGNOSIS — Z800 Family history of malignant neoplasm of digestive organs: Secondary | ICD-10-CM

## 2021-08-14 DIAGNOSIS — Z881 Allergy status to other antibiotic agents status: Secondary | ICD-10-CM | POA: Diagnosis not present

## 2021-08-14 DIAGNOSIS — D490 Neoplasm of unspecified behavior of digestive system: Secondary | ICD-10-CM | POA: Diagnosis present

## 2021-08-14 DIAGNOSIS — N400 Enlarged prostate without lower urinary tract symptoms: Secondary | ICD-10-CM | POA: Diagnosis present

## 2021-08-14 DIAGNOSIS — Z882 Allergy status to sulfonamides status: Secondary | ICD-10-CM

## 2021-08-14 DIAGNOSIS — Z888 Allergy status to other drugs, medicaments and biological substances status: Secondary | ICD-10-CM

## 2021-08-14 DIAGNOSIS — Z20822 Contact with and (suspected) exposure to covid-19: Secondary | ICD-10-CM | POA: Diagnosis present

## 2021-08-14 DIAGNOSIS — M4802 Spinal stenosis, cervical region: Secondary | ICD-10-CM | POA: Diagnosis not present

## 2021-08-14 DIAGNOSIS — E782 Mixed hyperlipidemia: Secondary | ICD-10-CM | POA: Diagnosis not present

## 2021-08-14 DIAGNOSIS — Z48815 Encounter for surgical aftercare following surgery on the digestive system: Secondary | ICD-10-CM | POA: Diagnosis not present

## 2021-08-14 DIAGNOSIS — Z886 Allergy status to analgesic agent status: Secondary | ICD-10-CM

## 2021-08-14 DIAGNOSIS — M19041 Primary osteoarthritis, right hand: Secondary | ICD-10-CM | POA: Diagnosis present

## 2021-08-14 DIAGNOSIS — D373 Neoplasm of uncertain behavior of appendix: Secondary | ICD-10-CM | POA: Diagnosis present

## 2021-08-14 DIAGNOSIS — Z880 Allergy status to penicillin: Secondary | ICD-10-CM | POA: Diagnosis not present

## 2021-08-14 DIAGNOSIS — K219 Gastro-esophageal reflux disease without esophagitis: Secondary | ICD-10-CM | POA: Diagnosis present

## 2021-08-14 DIAGNOSIS — Z885 Allergy status to narcotic agent status: Secondary | ICD-10-CM | POA: Diagnosis not present

## 2021-08-14 DIAGNOSIS — K660 Peritoneal adhesions (postprocedural) (postinfection): Secondary | ICD-10-CM | POA: Diagnosis not present

## 2021-08-14 DIAGNOSIS — C189 Malignant neoplasm of colon, unspecified: Secondary | ICD-10-CM | POA: Diagnosis not present

## 2021-08-14 DIAGNOSIS — C181 Malignant neoplasm of appendix: Secondary | ICD-10-CM | POA: Diagnosis not present

## 2021-08-14 DIAGNOSIS — G894 Chronic pain syndrome: Secondary | ICD-10-CM | POA: Diagnosis not present

## 2021-08-14 DIAGNOSIS — E669 Obesity, unspecified: Secondary | ICD-10-CM | POA: Diagnosis present

## 2021-08-14 LAB — TYPE AND SCREEN
ABO/RH(D): O POS
Antibody Screen: NEGATIVE

## 2021-08-14 LAB — ABO/RH: ABO/RH(D): O POS

## 2021-08-14 SURGERY — COLECTOMY, PARTIAL, ROBOT-ASSISTED, LAPAROSCOPIC
Anesthesia: General | Site: Abdomen

## 2021-08-14 MED ORDER — SODIUM CHLORIDE 0.9 % IV SOLN
2.0000 g | Freq: Once | INTRAVENOUS | Status: AC
  Administered 2021-08-14: 2 g via INTRAVENOUS
  Filled 2021-08-14: qty 2

## 2021-08-14 MED ORDER — BUPIVACAINE LIPOSOME 1.3 % IJ SUSP
INTRAMUSCULAR | Status: AC
  Filled 2021-08-14: qty 20

## 2021-08-14 MED ORDER — METHYLENE BLUE 0.5 % INJ SOLN
INTRAVENOUS | Status: AC
  Filled 2021-08-14: qty 10

## 2021-08-14 MED ORDER — LORATADINE 10 MG PO TABS
10.0000 mg | ORAL_TABLET | Freq: Every day | ORAL | Status: DC
  Administered 2021-08-15 – 2021-08-16 (×2): 10 mg via ORAL
  Filled 2021-08-14 (×2): qty 1

## 2021-08-14 MED ORDER — SODIUM CHLORIDE 0.9 % IV SOLN
2.0000 g | Freq: Two times a day (BID) | INTRAVENOUS | Status: AC
  Administered 2021-08-14: 2 g via INTRAVENOUS
  Filled 2021-08-14: qty 2

## 2021-08-14 MED ORDER — LACTATED RINGERS IV SOLN
INTRAVENOUS | Status: DC | PRN
Start: 2021-08-14 — End: 2021-08-14

## 2021-08-14 MED ORDER — ONDANSETRON HCL 4 MG/2ML IJ SOLN
INTRAMUSCULAR | Status: AC
  Filled 2021-08-14: qty 2

## 2021-08-14 MED ORDER — PHENYLEPHRINE 40 MCG/ML (10ML) SYRINGE FOR IV PUSH (FOR BLOOD PRESSURE SUPPORT)
PREFILLED_SYRINGE | INTRAVENOUS | Status: DC | PRN
  Administered 2021-08-14 (×2): 80 ug via INTRAVENOUS

## 2021-08-14 MED ORDER — LACTATED RINGERS IR SOLN
Status: DC | PRN
  Administered 2021-08-14: 1000 mL

## 2021-08-14 MED ORDER — ONDANSETRON HCL 4 MG/2ML IJ SOLN: 4.0000 mg | Freq: Four times a day (QID) | INTRAMUSCULAR | Status: DC | PRN

## 2021-08-14 MED ORDER — ONDANSETRON HCL 4 MG PO TABS: 4.0000 mg | ORAL_TABLET | Freq: Four times a day (QID) | ORAL | Status: DC | PRN

## 2021-08-14 MED ORDER — ONDANSETRON HCL 4 MG/2ML IJ SOLN: 4.0000 mg | Freq: Once | INTRAMUSCULAR | Status: DC | PRN

## 2021-08-14 MED ORDER — PROPOFOL 10 MG/ML IV BOLUS
INTRAVENOUS | Status: DC | PRN
Start: 2021-08-14 — End: 2021-08-14
  Administered 2021-08-14: 150 mg via INTRAVENOUS

## 2021-08-14 MED ORDER — ENSURE PRE-SURGERY PO LIQD: 296.0000 mL | Freq: Once | ORAL | Status: DC

## 2021-08-14 MED ORDER — ROCURONIUM BROMIDE 100 MG/10ML IV SOLN
INTRAVENOUS | Status: DC | PRN
  Administered 2021-08-14: 20 mg via INTRAVENOUS
  Administered 2021-08-14: 80 mg via INTRAVENOUS

## 2021-08-14 MED ORDER — DIPHENHYDRAMINE HCL 50 MG/ML IJ SOLN: 12.5000 mg | Freq: Four times a day (QID) | INTRAMUSCULAR | Status: DC | PRN

## 2021-08-14 MED ORDER — GABAPENTIN 300 MG PO CAPS
300.0000 mg | ORAL_CAPSULE | Freq: Two times a day (BID) | ORAL | Status: DC
  Administered 2021-08-14 – 2021-08-16 (×4): 300 mg via ORAL
  Filled 2021-08-14 (×4): qty 1

## 2021-08-14 MED ORDER — TRAMADOL HCL 50 MG PO TABS
50.0000 mg | ORAL_TABLET | Freq: Four times a day (QID) | ORAL | Status: DC | PRN
  Administered 2021-08-14: 50 mg via ORAL
  Administered 2021-08-15 (×4): 100 mg via ORAL
  Filled 2021-08-14: qty 1
  Filled 2021-08-14 (×4): qty 2

## 2021-08-14 MED ORDER — HYDROMORPHONE HCL 1 MG/ML IJ SOLN
0.5000 mg | INTRAMUSCULAR | Status: DC | PRN
  Administered 2021-08-14: 0.5 mg via INTRAVENOUS
  Filled 2021-08-14: qty 0.5

## 2021-08-14 MED ORDER — ACETAMINOPHEN 10 MG/ML IV SOLN: 1000.0000 mg | Freq: Once | INTRAVENOUS | Status: DC | PRN

## 2021-08-14 MED ORDER — LIDOCAINE HCL (PF) 2 % IJ SOLN
INTRAMUSCULAR | Status: DC | PRN
  Administered 2021-08-14: 1.5 mg/kg/h via INTRADERMAL

## 2021-08-14 MED ORDER — HYDROMORPHONE HCL 1 MG/ML IJ SOLN
INTRAMUSCULAR | Status: AC
  Administered 2021-08-14: 0.5 mg via INTRAVENOUS
  Filled 2021-08-14: qty 1

## 2021-08-14 MED ORDER — DROPERIDOL 2.5 MG/ML IJ SOLN
INTRAMUSCULAR | Status: DC | PRN
  Administered 2021-08-14: .625 mg via INTRAVENOUS

## 2021-08-14 MED ORDER — ORAL CARE MOUTH RINSE: 15.0000 mL | Freq: Once | OROMUCOSAL | Status: AC

## 2021-08-14 MED ORDER — FLUTICASONE PROPIONATE 50 MCG/ACT NA SUSP: 1.0000 | Freq: Every day | NASAL | Status: DC | PRN

## 2021-08-14 MED ORDER — ONDANSETRON HCL 4 MG/2ML IJ SOLN
INTRAMUSCULAR | Status: DC | PRN
  Administered 2021-08-14: 4 mg via INTRAVENOUS

## 2021-08-14 MED ORDER — DIPHENHYDRAMINE HCL 12.5 MG/5ML PO ELIX: 12.5000 mg | ORAL_SOLUTION | Freq: Four times a day (QID) | ORAL | Status: DC | PRN

## 2021-08-14 MED ORDER — DEXAMETHASONE SODIUM PHOSPHATE 10 MG/ML IJ SOLN
INTRAMUSCULAR | Status: DC | PRN
  Administered 2021-08-14: 10 mg via INTRAVENOUS

## 2021-08-14 MED ORDER — 0.9 % SODIUM CHLORIDE (POUR BTL) OPTIME
TOPICAL | Status: DC | PRN
  Administered 2021-08-14: 2000 mL

## 2021-08-14 MED ORDER — ENSURE SURGERY PO LIQD
237.0000 mL | Freq: Two times a day (BID) | ORAL | Status: DC
  Administered 2021-08-16: 237 mL via ORAL

## 2021-08-14 MED ORDER — BUPIVACAINE-EPINEPHRINE 0.25% -1:200000 IJ SOLN
INTRAMUSCULAR | Status: DC | PRN
  Administered 2021-08-14: 30 mL

## 2021-08-14 MED ORDER — LACTATED RINGERS IV SOLN: INTRAVENOUS | Status: DC

## 2021-08-14 MED ORDER — ACETAMINOPHEN 500 MG PO TABS
1000.0000 mg | ORAL_TABLET | Freq: Four times a day (QID) | ORAL | Status: DC
  Administered 2021-08-14 – 2021-08-16 (×4): 1000 mg via ORAL
  Filled 2021-08-14 (×6): qty 2

## 2021-08-14 MED ORDER — KCL IN DEXTROSE-NACL 20-5-0.45 MEQ/L-%-% IV SOLN
INTRAVENOUS | Status: DC
  Filled 2021-08-14 (×2): qty 1000

## 2021-08-14 MED ORDER — HEPARIN SODIUM (PORCINE) 5000 UNIT/ML IJ SOLN
5000.0000 [IU] | Freq: Once | INTRAMUSCULAR | Status: AC
  Administered 2021-08-14: 5000 [IU] via SUBCUTANEOUS
  Filled 2021-08-14: qty 1

## 2021-08-14 MED ORDER — ROSUVASTATIN CALCIUM 20 MG PO TABS
20.0000 mg | ORAL_TABLET | Freq: Every day | ORAL | Status: DC
  Administered 2021-08-15 – 2021-08-16 (×2): 20 mg via ORAL
  Filled 2021-08-14 (×2): qty 1

## 2021-08-14 MED ORDER — BUPIVACAINE LIPOSOME 1.3 % IJ SUSP: 20.0000 mL | Freq: Once | INTRAMUSCULAR | Status: DC

## 2021-08-14 MED ORDER — MIDAZOLAM HCL 2 MG/2ML IJ SOLN
INTRAMUSCULAR | Status: AC
  Filled 2021-08-14: qty 2

## 2021-08-14 MED ORDER — GLYCOPYRROLATE 0.2 MG/ML IJ SOLN
INTRAMUSCULAR | Status: DC | PRN
  Administered 2021-08-14: .1 mg via INTRAVENOUS

## 2021-08-14 MED ORDER — SACCHAROMYCES BOULARDII 250 MG PO CAPS
250.0000 mg | ORAL_CAPSULE | Freq: Two times a day (BID) | ORAL | Status: DC
  Administered 2021-08-14 – 2021-08-16 (×4): 250 mg via ORAL
  Filled 2021-08-14 (×4): qty 1

## 2021-08-14 MED ORDER — FENTANYL CITRATE (PF) 100 MCG/2ML IJ SOLN
INTRAMUSCULAR | Status: DC | PRN
  Administered 2021-08-14 (×5): 50 ug via INTRAVENOUS

## 2021-08-14 MED ORDER — SUGAMMADEX SODIUM 200 MG/2ML IV SOLN
INTRAVENOUS | Status: DC | PRN
  Administered 2021-08-14: 200 mg via INTRAVENOUS

## 2021-08-14 MED ORDER — ALVIMOPAN 12 MG PO CAPS
12.0000 mg | ORAL_CAPSULE | ORAL | Status: AC
  Administered 2021-08-14: 12 mg via ORAL
  Filled 2021-08-14: qty 1

## 2021-08-14 MED ORDER — MIDAZOLAM HCL 5 MG/5ML IJ SOLN
INTRAMUSCULAR | Status: DC | PRN
  Administered 2021-08-14: 2 mg via INTRAVENOUS

## 2021-08-14 MED ORDER — ALVIMOPAN 12 MG PO CAPS
12.0000 mg | ORAL_CAPSULE | Freq: Two times a day (BID) | ORAL | Status: DC
  Administered 2021-08-15 – 2021-08-16 (×3): 12 mg via ORAL
  Filled 2021-08-14 (×3): qty 1

## 2021-08-14 MED ORDER — GABAPENTIN 300 MG PO CAPS
300.0000 mg | ORAL_CAPSULE | ORAL | Status: AC
  Administered 2021-08-14: 300 mg via ORAL
  Filled 2021-08-14: qty 1

## 2021-08-14 MED ORDER — ACETAMINOPHEN 500 MG PO TABS
1000.0000 mg | ORAL_TABLET | ORAL | Status: AC
  Administered 2021-08-14: 1000 mg via ORAL
  Filled 2021-08-14: qty 2

## 2021-08-14 MED ORDER — BUPIVACAINE-EPINEPHRINE (PF) 0.25% -1:200000 IJ SOLN
INTRAMUSCULAR | Status: AC
  Filled 2021-08-14: qty 30

## 2021-08-14 MED ORDER — FENTANYL CITRATE (PF) 250 MCG/5ML IJ SOLN
INTRAMUSCULAR | Status: AC
  Filled 2021-08-14: qty 5

## 2021-08-14 MED ORDER — LIDOCAINE 2% (20 MG/ML) 5 ML SYRINGE
INTRAMUSCULAR | Status: DC | PRN
  Administered 2021-08-14: 100 mg via INTRAVENOUS

## 2021-08-14 MED ORDER — BUPIVACAINE LIPOSOME 1.3 % IJ SUSP
INTRAMUSCULAR | Status: DC | PRN
  Administered 2021-08-14: 20 mL

## 2021-08-14 MED ORDER — ALUM & MAG HYDROXIDE-SIMETH 200-200-20 MG/5ML PO SUSP: 30.0000 mL | Freq: Four times a day (QID) | ORAL | Status: DC | PRN

## 2021-08-14 MED ORDER — ENOXAPARIN SODIUM 40 MG/0.4ML IJ SOSY
40.0000 mg | PREFILLED_SYRINGE | INTRAMUSCULAR | Status: DC
  Administered 2021-08-15 – 2021-08-16 (×2): 40 mg via SUBCUTANEOUS
  Filled 2021-08-14 (×2): qty 0.4

## 2021-08-14 MED ORDER — ENSURE PRE-SURGERY PO LIQD: 592.0000 mL | Freq: Once | ORAL | Status: DC

## 2021-08-14 MED ORDER — KETAMINE HCL 10 MG/ML IJ SOLN
INTRAMUSCULAR | Status: DC | PRN
  Administered 2021-08-14 (×4): 10 mg via INTRAVENOUS

## 2021-08-14 MED ORDER — HYDROMORPHONE HCL 1 MG/ML IJ SOLN
0.2500 mg | INTRAMUSCULAR | Status: DC | PRN
  Administered 2021-08-14 (×2): 0.5 mg via INTRAVENOUS

## 2021-08-14 MED ORDER — GLYCOPYRROLATE 0.2 MG/ML IJ SOLN
INTRAMUSCULAR | Status: AC
  Filled 2021-08-14: qty 1

## 2021-08-14 MED ORDER — KETAMINE HCL 50 MG/5ML IJ SOSY
PREFILLED_SYRINGE | INTRAMUSCULAR | Status: AC
  Filled 2021-08-14: qty 5

## 2021-08-14 MED ORDER — CHLORHEXIDINE GLUCONATE 0.12 % MT SOLN
15.0000 mL | Freq: Once | OROMUCOSAL | Status: AC
  Administered 2021-08-14: 15 mL via OROMUCOSAL

## 2021-08-14 SURGICAL SUPPLY — 94 items
BAG COUNTER SPONGE SURGICOUNT (BAG) ×1 IMPLANT
BLADE EXTENDED COATED 6.5IN (ELECTRODE) IMPLANT
CANNULA REDUC XI 12-8 STAPL (CANNULA) ×2
CANNULA REDUCER 12-8 DVNC XI (CANNULA) IMPLANT
CELLS DAT CNTRL 66122 CELL SVR (MISCELLANEOUS) ×1 IMPLANT
COVER SURGICAL LIGHT HANDLE (MISCELLANEOUS) ×3 IMPLANT
COVER TIP SHEARS 8 DVNC (MISCELLANEOUS) ×1 IMPLANT
COVER TIP SHEARS 8MM DA VINCI (MISCELLANEOUS) ×2
DECANTER SPIKE VIAL GLASS SM (MISCELLANEOUS) IMPLANT
DRAIN CHANNEL 19F RND (DRAIN) IMPLANT
DRAPE ARM DVNC X/XI (DISPOSABLE) ×4 IMPLANT
DRAPE COLUMN DVNC XI (DISPOSABLE) ×1 IMPLANT
DRAPE DA VINCI XI ARM (DISPOSABLE) ×8
DRAPE DA VINCI XI COLUMN (DISPOSABLE) ×2
DRAPE SURG IRRIG POUCH 19X23 (DRAPES) ×1 IMPLANT
DRSG OPSITE POSTOP 4X10 (GAUZE/BANDAGES/DRESSINGS) IMPLANT
DRSG OPSITE POSTOP 4X6 (GAUZE/BANDAGES/DRESSINGS) IMPLANT
DRSG OPSITE POSTOP 4X8 (GAUZE/BANDAGES/DRESSINGS) ×1 IMPLANT
ELECT PENCIL ROCKER SW 15FT (MISCELLANEOUS) ×2 IMPLANT
ELECT REM PT RETURN 15FT ADLT (MISCELLANEOUS) ×2 IMPLANT
ENDOLOOP SUT PDS II  0 18 (SUTURE)
ENDOLOOP SUT PDS II 0 18 (SUTURE) IMPLANT
EVACUATOR SILICONE 100CC (DRAIN) IMPLANT
GLOVE SURG ENC MOIS LTX SZ6.5 (GLOVE) ×6 IMPLANT
GLOVE SURG UNDER LTX SZ6.5 (GLOVE) ×2 IMPLANT
GLOVE SURG UNDER POLY LF SZ7 (GLOVE) ×4 IMPLANT
GOWN STRL REUS W/TWL XL LVL3 (GOWN DISPOSABLE) ×6 IMPLANT
GRASPER SUT TROCAR 14GX15 (MISCELLANEOUS) IMPLANT
HOLDER FOLEY CATH W/STRAP (MISCELLANEOUS) ×2 IMPLANT
IRRIG SUCT STRYKERFLOW 2 WTIP (MISCELLANEOUS) ×2
IRRIGATION SUCT STRKRFLW 2 WTP (MISCELLANEOUS) ×1 IMPLANT
KIT PROCEDURE DA VINCI SI (MISCELLANEOUS)
KIT PROCEDURE DVNC SI (MISCELLANEOUS) IMPLANT
KIT TURNOVER KIT A (KITS) ×1 IMPLANT
NDL INSUFFLATION 14GA 120MM (NEEDLE) ×1 IMPLANT
NEEDLE INSUFFLATION 14GA 120MM (NEEDLE) ×2 IMPLANT
PACK CARDIOVASCULAR III (CUSTOM PROCEDURE TRAY) ×1 IMPLANT
PACK COLON (CUSTOM PROCEDURE TRAY) ×2 IMPLANT
PAD POSITIONING PINK XL (MISCELLANEOUS) ×2 IMPLANT
RELOAD STAPLE 60 2.5 WHT DVNC (STAPLE) IMPLANT
RELOAD STAPLE 60 3.5 BLU DVNC (STAPLE) IMPLANT
RELOAD STAPLE 60 4.3 GRN DVNC (STAPLE) IMPLANT
RELOAD STAPLER 2.5X60 WHT DVNC (STAPLE) ×1 IMPLANT
RELOAD STAPLER 3.5X60 BLU DVNC (STAPLE) ×1 IMPLANT
RELOAD STAPLER 4.3X60 GRN DVNC (STAPLE) ×2 IMPLANT
RETRACTOR WND ALEXIS 18 MED (MISCELLANEOUS) IMPLANT
RTRCTR WOUND ALEXIS 18CM MED (MISCELLANEOUS) ×2
SCISSORS LAP 5X35 DISP (ENDOMECHANICALS) ×1 IMPLANT
SEAL CANN UNIV 5-8 DVNC XI (MISCELLANEOUS) ×3 IMPLANT
SEAL XI 5MM-8MM UNIVERSAL (MISCELLANEOUS) ×6
SEALER VESSEL DA VINCI XI (MISCELLANEOUS) ×2
SEALER VESSEL EXT DVNC XI (MISCELLANEOUS) ×1 IMPLANT
SOLUTION ELECTROLUBE (MISCELLANEOUS) ×2 IMPLANT
STAPLER 60 DA VINCI SURE FORM (STAPLE) ×2
STAPLER 60 SUREFORM DVNC (STAPLE) IMPLANT
STAPLER CANNULA SEAL DVNC XI (STAPLE) IMPLANT
STAPLER CANNULA SEAL XI (STAPLE)
STAPLER ECHELON POWER CIR 29 (STAPLE) IMPLANT
STAPLER ECHELON POWER CIR 31 (STAPLE) IMPLANT
STAPLER RELOAD 2.5X60 WHITE (STAPLE) ×2
STAPLER RELOAD 2.5X60 WHT DVNC (STAPLE) ×1
STAPLER RELOAD 3.5X60 BLU DVNC (STAPLE) ×1
STAPLER RELOAD 3.5X60 BLUE (STAPLE) ×2
STAPLER RELOAD 4.3X60 GREEN (STAPLE) ×4
STAPLER RELOAD 4.3X60 GRN DVNC (STAPLE) ×2
STOPCOCK 4 WAY LG BORE MALE ST (IV SETS) ×2 IMPLANT
SUT ETHILON 2 0 PS N (SUTURE) IMPLANT
SUT NOVA NAB DX-16 0-1 5-0 T12 (SUTURE) ×4 IMPLANT
SUT PROLENE 2 0 KS (SUTURE) IMPLANT
SUT SILK 2 0 (SUTURE) ×2
SUT SILK 2 0 SH CR/8 (SUTURE) IMPLANT
SUT SILK 2-0 18XBRD TIE 12 (SUTURE) ×1 IMPLANT
SUT SILK 3 0 (SUTURE)
SUT SILK 3 0 SH CR/8 (SUTURE) ×2 IMPLANT
SUT SILK 3-0 18XBRD TIE 12 (SUTURE) IMPLANT
SUT V-LOC BARB 180 2/0GR6 GS22 (SUTURE) ×4
SUT VIC AB 2-0 SH 18 (SUTURE) IMPLANT
SUT VIC AB 2-0 SH 27 (SUTURE)
SUT VIC AB 2-0 SH 27X BRD (SUTURE) IMPLANT
SUT VIC AB 3-0 SH 18 (SUTURE) IMPLANT
SUT VIC AB 4-0 PS2 27 (SUTURE) ×4 IMPLANT
SUT VICRYL 0 UR6 27IN ABS (SUTURE) ×2 IMPLANT
SUTURE V-LC BRB 180 2/0GR6GS22 (SUTURE) IMPLANT
SYR 10ML ECCENTRIC (SYRINGE) ×2 IMPLANT
SYS LAPSCP GELPORT 120MM (MISCELLANEOUS)
SYS WOUND ALEXIS 18CM MED (MISCELLANEOUS)
SYSTEM LAPSCP GELPORT 120MM (MISCELLANEOUS) IMPLANT
SYSTEM WOUND ALEXIS 18CM MED (MISCELLANEOUS) IMPLANT
TOWEL OR 17X26 10 PK STRL BLUE (TOWEL DISPOSABLE) ×1 IMPLANT
TOWEL OR NON WOVEN STRL DISP B (DISPOSABLE) ×2 IMPLANT
TRAY FOLEY MTR SLVR 16FR STAT (SET/KITS/TRAYS/PACK) ×2 IMPLANT
TROCAR ADV FIXATION 5X100MM (TROCAR) ×2 IMPLANT
TUBING CONNECTING 10 (TUBING) ×3 IMPLANT
TUBING INSUFFLATION 10FT LAP (TUBING) ×2 IMPLANT

## 2021-08-14 NOTE — Op Note (Signed)
08/14/2021  2:19 PM  PATIENT:  Stephen Carey  66 y.o. male  Patient Care Team: Lavone Orn, MD as PCP - General (Internal Medicine) Algie Coffer, Mindi Slicker, RN as Oncology Nurse Navigator  PRE-OPERATIVE DIAGNOSIS:  COLON CANCER  POST-OPERATIVE DIAGNOSIS:  COLON CANCER  PROCEDURE:  XI ROBOT ASSISTED LAPAROSCOPIC PARTIAL COLECTOMY    Surgeon(s): Leighton Ruff, MD Carlena Hurl, PA-C  ASSISTANT: Carlena Hurl, PA   ANESTHESIA:   local and general  EBL:62ml  Total I/O In: 1100 [I.V.:1000; IV Piggyback:100] Out: 75 [Urine:50; Blood:25]  Delay start of Pharmacological VTE agent (>24hrs) due to surgical blood loss or risk of bleeding:  no  DRAINS: none   SPECIMEN:  Source of Specimen:  R colon  DISPOSITION OF SPECIMEN:  PATHOLOGY  COUNTS:  YES  PLAN OF CARE: Admit to inpatient   PATIENT DISPOSITION:  PACU - hemodynamically stable.  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  I recommended segmental resection:  The anatomy & physiology of the digestive tract was discussed.  The pathophysiology was discussed.  Natural history risks without surgery was discussed.   I worked to give an overview of the disease and the frequent need to have multispecialty involvement.  I feel the risks of no intervention will lead to serious problems that outweigh the operative risks; therefore, I recommended a partial colectomy to remove the pathology.  Laparoscopic & open techniques were discussed.   Risks such as bleeding, infection, abscess, leak, reoperation, possible ostomy, hernia, heart attack, death, and other risks were discussed.  I noted a good likelihood this will help address the problem.   Goals of post-operative recovery were discussed as well.    The patient expressed understanding & wished to proceed with surgery.  OR FINDINGS:   Patient had some mild inflammatory adhesions in the RLQ.  No obvious metastatic disease on visceral parietal  peritoneum or liver.  DESCRIPTION:   Informed consent was confirmed.  The patient underwent general anaesthesia without difficulty.  The patient was positioned appropriately.  VTE prevention in place.  The patient's abdomen was clipped, prepped, & draped in a sterile fashion.  Surgical timeout confirmed our plan.  The patient was positioned in reverse Trendelenburg.  Abdominal entry was gained using a Varies needle in the LUQ.  Entry was clean.  I induced carbon dioxide insufflation.  An 2mm robotic port was placed in the LUQ.  Camera inspection revealed no injury.  Extra ports were carefully placed under direct laparoscopic visualization.  I laparoscopically reflected the greater omentum and the upper abdomen the small bowel in the peilvis. The patient was appropriately positioned and the robot was docked to the patient's right side.  Instruments were placed under direct visualization.    I began by identifying the ileocolic artery and vein within the mesentery. Dissection was bluntly carried around these structures. The duodenum was identified and free from the structures. I then separated the structures bluntly and used the robotic vessel sealer device to transect these.  I developed the retroperitoneal plane bluntly.  I then freed the appendix off its attachments to the pelvic wall. I mobilized the terminal ileum.  I took care to avoid injuring any retroperitoneal structures.  After this I began to mobilize laterally down the white line of [place] and then took down the hepatic flexure using the Enseal device. I mobilized the omentum off of the right transverse colon. The entire colon was then flipped medially and mobilized off  of the retroperitoneal structures until I could visualize the lateral edge of the duodenum underneath.  I gently freed the duodenal attachments.   I identified a portion of mesentery of the transverse colon just proximal to the right branch of the middle colic.  I divided up to  the colon from the previous dissection of the mesentery using the robotic vessel sealer.  I then divided the terminal ileal mesentery in similar fashion.  At that point, the terminal ileum was divided with a blue load robotic 60 mm stapler.  The transverse colon was divided with a green load robotic stapler x2.  The specimen was then completely free and placed in the right upper quadrant.  Hemostasis was good.  I then oriented the remaining terminal ileum and transverse colon and a isoperistaltic fashion.  I placed an enterotomy in the small bowel and colon using the robotic scissors.  I then introduced a white load 60 mm robotic stapler into both enterotomies and created an anastomosis between the small bowel and transverse colon.  Hemostasis within the staple line was good.  The common enterotomy channel was closed using 2 running 2-0 V-Loc sutures.  The abdomen was then irrigated with normal saline. The omentum was then brought down over the anastomosis.  At this point the robot was undocked.  The 12 mm suprapubic port was enlarged to a Pfannenstiel incision and an [place] wound protector was placed.  The specimen was removed from the abdomen and evaluated.  Once the abdomen was inspected for hemostasis, the [place] wound protector was removed.    The peritoneum of the Pfannenstiel incision was closed using a running 0 Vicryl suture.  The fascia was then closed using 2 #1 Novafil running sutures.  The subcutaneous tissue of the extraction incision was closed using a running 2-0 Vicryl suture. The skin was then closed using running subcuticular 4-0 Vicryl sutures.  A sterile dressing was applied.  The remaining port sites were closed using interrupted 4-0 Vicryl sutures and Dermabond. All counts were correct per operating room staff. The patient was then awakened from anesthesia and sent to the post anesthesia care unit in stable condition.

## 2021-08-14 NOTE — Anesthesia Preprocedure Evaluation (Signed)
Anesthesia Evaluation  Patient identified by MRN, date of birth, ID band Patient awake    Reviewed: Allergy & Precautions, NPO status , Patient's Chart, lab work & pertinent test results  Airway Mallampati: II  TM Distance: >3 FB Neck ROM: Full    Dental no notable dental hx.    Pulmonary neg pulmonary ROS, former smoker,    Pulmonary exam normal breath sounds clear to auscultation       Cardiovascular negative cardio ROS Normal cardiovascular exam Rhythm:Regular Rate:Normal     Neuro/Psych negative neurological ROS  negative psych ROS   GI/Hepatic Neg liver ROS, GERD  ,  Endo/Other  negative endocrine ROS  Renal/GU negative Renal ROS  negative genitourinary   Musculoskeletal negative musculoskeletal ROS (+)   Abdominal   Peds negative pediatric ROS (+)  Hematology negative hematology ROS (+)   Anesthesia Other Findings   Reproductive/Obstetrics negative OB ROS                             Anesthesia Physical Anesthesia Plan  ASA: 2  Anesthesia Plan: General   Post-op Pain Management: Dilaudid IV   Induction: Intravenous  PONV Risk Score and Plan: 2 and Ondansetron, Dexamethasone and Treatment may vary due to age or medical condition  Airway Management Planned: Oral ETT  Additional Equipment:   Intra-op Plan:   Post-operative Plan: Extubation in OR  Informed Consent: I have reviewed the patients History and Physical, chart, labs and discussed the procedure including the risks, benefits and alternatives for the proposed anesthesia with the patient or authorized representative who has indicated his/her understanding and acceptance.     Dental advisory given  Plan Discussed with: CRNA and Surgeon  Anesthesia Plan Comments:         Anesthesia Quick Evaluation

## 2021-08-14 NOTE — Anesthesia Procedure Notes (Signed)
Procedure Name: Intubation Date/Time: 08/14/2021 12:05 PM Performed by: Gwyndolyn Saxon, CRNA Pre-anesthesia Checklist: Patient identified, Emergency Drugs available, Suction available and Patient being monitored Patient Re-evaluated:Patient Re-evaluated prior to induction Oxygen Delivery Method: Circle system utilized Preoxygenation: Pre-oxygenation with 100% oxygen Induction Type: IV induction Ventilation: Mask ventilation without difficulty Laryngoscope Size: Miller and 2 Grade View: Grade I Tube type: Oral Tube size: 7.5 mm Number of attempts: 1 Airway Equipment and Method: Patient positioned with wedge pillow and Stylet Placement Confirmation: ETT inserted through vocal cords under direct vision, positive ETCO2 and breath sounds checked- equal and bilateral Secured at: 24 cm Tube secured with: Tape Dental Injury: Teeth and Oropharynx as per pre-operative assessment

## 2021-08-14 NOTE — H&P (Signed)
History of Present Illness: Stephen Carey is a 66 y.o. male who was seen as an office consultation at the request of Dr. Laurann Montana for evaluation of abd pain and abnormal CT scan.  He was noted to have a dilated appendix and therefore underwent laparoscopic appendectomy by Dr. Ninfa Linden.  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  He is here today for further treatment.  He does have a family history of a father with GI cancer.     Review of Systems: A complete review of systems was obtained from the patient.  I have reviewed this information and discussed as appropriate with the patient.  See HPI as well for other ROS.       Medical History:     Past Medical History:  Diagnosis Date   Arthritis     GERD (gastroesophageal reflux disease)     Hyperlipidemia           Patient Active Problem List  Diagnosis   Benign prostatic hyperplasia without lower urinary tract symptoms   Body mass index (BMI) 33.0-33.9, adult   Obesity   Cervical spinal stenosis   Cervical spondylosis   Chronic bilateral low back pain without sciatica   Chronic pain syndrome   Generalized osteoarthritis   Left hip pain   Mixed hyperlipidemia   Numbness and tingling   Osteoarthritis of finger of right hand   Upper abdominal pain           Past Surgical History:  Procedure Laterality Date   APPENDECTOMY       Cataract Surgery        Elbow Surgery  Bilateral     Foot Surgery        Hand Surgery        HERNIA REPAIR       Knee Surgery  Bilateral     Shoulder Surgery                Allergies  Allergen Reactions   Penicillins Other (See Comments), Rash and Unknown      REACTION: as child REACTION: as child Unknown, childhood     Atorvastatin Other (See Comments)      myalgias     Oxycodone-Acetaminophen Other (See Comments)      GI upset     Doxycycline Diarrhea, Other (See Comments) and Rash      REACTION: severe diarrhea REACTION: severe diarrhea REACTION:  severe diarrhea     Sulfa (Sulfonamide Antibiotics) Hives and Rash      REACTION: rash, nausea              Current Outpatient Medications on File Prior to Visit  Medication Sig Dispense Refill   aspirin 81 MG EC tablet Take 1 tablet (81 mg total) by mouth once daily       fluticasone propionate (FLONASE) 50 mcg/actuation nasal spray 1 spray in each nostril       gabapentin (NEURONTIN) 100 MG capsule 3 capsule       gabapentin (NEURONTIN) 300 MG capsule 1 capsule       omeprazole (PRILOSEC) 20 MG DR capsule Take 1 capsule (20 mg total) by mouth once daily       peppermint oiL (IBGARD) 90 mg CECX See admin instructions.       predniSONE (DELTASONE) 20 MG tablet 2 tablets       rosuvastatin (CRESTOR) 10 MG tablet Take 1 tablet (10 mg total) by mouth once daily  sildenafil (REVATIO) 20 mg tablet 2-5 tablet       traMADoL (ULTRAM) 50 mg tablet 2 tablets        No current facility-administered medications on file prior to visit.      History reviewed. No pertinent family history.    Social History       Tobacco Use  Smoking Status Never  Smokeless Tobacco Never      Social History        Socioeconomic History   Marital status: Married  Tobacco Use   Smoking status: Never   Smokeless tobacco: Never  Vaping Use   Vaping Use: Never used  Substance and Sexual Activity   Alcohol use: Never   Drug use: Never      Objective:      Exam Gen: NAD CV: RRR Lungs: CTA Abd: soft, incisions healing well       Labs, Imaging and Diagnostic Testing: APPENDIX, CARCINOMA: Resection   Procedure: Appendectomy  Tumor Size: See comment  Histologic Type: Goblet cell adenocarcinoma  Histologic Grade: Grade 3  Tumor Deposits: Not identified  Tumor Extension: Acellular mucin invades visceral peritoneum  Lymphovascular Invasion: Not readily identified  Perineural invasion: Present  Margins:       Margin Status for Invasive Carcinoma: Proximal margin involved by  invasive  carcinoma  Regional Lymph Nodes: Not applicable (no lymph nodes submitted or found)       Distant Metastasis:  Not applicable  Pathologic Stage Classification (pTNM, AJCC 8th Edition): pT4a, pN not      CT A/P IMPRESSION: 1. Newly dilated and mildly thick walled appendix without significant periappendiceal inflammatory changes. Findings are equivocal for acute appendicitis. No free air. No abscess. Consider surgical consultation at this time as clinically warranted. 2. Mild sigmoid diverticulosis, with no evidence of acute diverticulitis. 3. Evolving mild postinfectious/postinflammatory scarring or atelectasis in the medial basilar right lower lung lobe. 4. Aortic Atherosclerosis (ICD10-I70.0).   Assessment and Plan:  Diagnoses and all orders for this visit:   Mucinous adenocarcinoma of appendix (CMS-HCC) -     Carcinoembryonic Antigen (CEA)     Patient has adenocarcinoma of the appendix with positive margins.   CT chest is within normal limits.  CEA will be drawn today.  I have recommended robotic assisted right colectomy.  We have discussed the surgery in detail.  All questions were answered.   The surgery and anatomy were described to the patient as well as the risks of surgery and the possible complications.  These include: Bleeding, deep abdominal infections and possible wound complications such as hernia and infection, damage to adjacent structures, leak of surgical connections, which can lead to other surgeries and possibly an ostomy, possible need for other procedures, such as abscess drains in radiology, possible prolonged hospital stay, possible diarrhea from removal of part of the colon, possible constipation from narcotics, possible bowel, bladder or sexual dysfunction if having rectal surgery, prolonged fatigue/weakness or appetite loss, possible early recurrence of of disease, possible complications of their medical problems such as heart disease or arrhythmias or lung  problems, death (less than 1%). I believe the patient understands and wishes to proceed with the surgery.

## 2021-08-14 NOTE — Plan of Care (Signed)

## 2021-08-14 NOTE — Anesthesia Postprocedure Evaluation (Signed)
Anesthesia Post Note  Patient: Stephen Carey  Procedure(s) Performed: XI ROBOT ASSISTED LAPAROSCOPIC PARTIAL COLECTOMY (Abdomen)     Patient location during evaluation: PACU Anesthesia Type: General Level of consciousness: awake and alert Pain management: pain level controlled Vital Signs Assessment: post-procedure vital signs reviewed and stable Respiratory status: spontaneous breathing, nonlabored ventilation, respiratory function stable and patient connected to nasal cannula oxygen Cardiovascular status: blood pressure returned to baseline and stable Postop Assessment: no apparent nausea or vomiting Anesthetic complications: no   No notable events documented.  Last Vitals:  Vitals:   08/14/21 1445 08/14/21 1500  BP: (!) 182/85 (!) 161/79  Pulse: 60 64  Resp: 14 (!) 21  Temp:    SpO2: 96% 100%    Last Pain:  Vitals:   08/14/21 1445  TempSrc:   PainSc: 6                  Ashyia Schraeder S

## 2021-08-14 NOTE — Transfer of Care (Signed)
Immediate Anesthesia Transfer of Care Note  Patient: Stephen Carey  Procedure(s) Performed: XI ROBOT ASSISTED LAPAROSCOPIC PARTIAL COLECTOMY (Abdomen)  Patient Location: PACU  Anesthesia Type:General  Level of Consciousness: awake and patient cooperative  Airway & Oxygen Therapy: Patient Spontanous Breathing  Post-op Assessment: Report given to RN and Post -op Vital signs reviewed and stable  Post vital signs: Reviewed and stable  Last Vitals:  Vitals Value Taken Time  BP 158/75 08/14/21 1430  Temp    Pulse 57 08/14/21 1432  Resp 10 08/14/21 1432  SpO2 97 % 08/14/21 1432  Vitals shown include unvalidated device data.  Last Pain:  Vitals:   08/14/21 1130  TempSrc: Oral  PainSc: 0-No pain         Complications: No notable events documented.

## 2021-08-15 ENCOUNTER — Encounter: Payer: Self-pay | Admitting: *Deleted

## 2021-08-15 ENCOUNTER — Other Ambulatory Visit (HOSPITAL_COMMUNITY): Payer: Self-pay

## 2021-08-15 LAB — CBC
HCT: 41.5 % (ref 39.0–52.0)
Hemoglobin: 14 g/dL (ref 13.0–17.0)
MCH: 32.3 pg (ref 26.0–34.0)
MCHC: 33.7 g/dL (ref 30.0–36.0)
MCV: 95.8 fL (ref 80.0–100.0)
Platelets: 164 10*3/uL (ref 150–400)
RBC: 4.33 MIL/uL (ref 4.22–5.81)
RDW: 12.7 % (ref 11.5–15.5)
WBC: 12.1 10*3/uL — ABNORMAL HIGH (ref 4.0–10.5)
nRBC: 0 % (ref 0.0–0.2)

## 2021-08-15 LAB — CEA: CEA: 3 ng/mL (ref 0.0–4.7)

## 2021-08-15 LAB — BASIC METABOLIC PANEL
Anion gap: 7 (ref 5–15)
BUN: 11 mg/dL (ref 8–23)
CO2: 28 mmol/L (ref 22–32)
Calcium: 8.3 mg/dL — ABNORMAL LOW (ref 8.9–10.3)
Chloride: 101 mmol/L (ref 98–111)
Creatinine, Ser: 1.04 mg/dL (ref 0.61–1.24)
GFR, Estimated: >60 mL/min (ref >60)
Glucose, Bld: 136 mg/dL — ABNORMAL HIGH (ref 70–99)
Potassium: 4.5 mmol/L (ref 3.5–5.1)
Sodium: 136 mmol/L (ref 135–145)

## 2021-08-15 MED ORDER — TRAMADOL HCL 50 MG PO TABS
50.0000 mg | ORAL_TABLET | Freq: Four times a day (QID) | ORAL | 0 refills | Status: AC | PRN
  Filled 2021-08-15: qty 20, 3d supply, fill #0

## 2021-08-15 NOTE — Progress Notes (Signed)
°  Transition of Care [facility]) Screening Note   Patient Details  Name: Stephen Carey Date of Birth: 10/21/1955   [facility] of [facility]) CM/SW Contact:    Lennart Pall, LCSW Phone Number: 08/15/2021, 1:06 PM    Transition of Care Department [facility]) has reviewed patient and no TOC needs have been identified at this time. We will continue to monitor patient advancement through interdisciplinary progression rounds. If new patient transition needs arise, please place a TOC consult.

## 2021-08-15 NOTE — Progress Notes (Signed)
1 Day Post-Op robotic R colectomy Subjective: Doing well, min pain, passing flatus  Objective: Vital signs in last 24 hours: Temp:  [97.7 F (36.5 C)-99.2 F (37.3 C)] 98.6 F (37 C) (01/19 0558) Pulse Rate:  [60-89] 84 (01/19 0558) Resp:  [12-21] 16 (01/19 0558) BP: (135-182)/(63-94) 138/94 (01/19 0558) SpO2:  [92 %-100 %] 98 % (01/19 0558) Weight:  [103.4 kg] 103.4 kg (01/18 1552)   Intake/Output from previous day: 01/18 0701 - 01/19 0700 In: 3502.7 [P.O.:1080; I.V.:2322.7; IV Piggyback:100] Out: 2175 [Urine:2150; Blood:25] Intake/Output this shift: No intake/output data recorded.   General appearance: alert and cooperative GI: soft, non-distended  Incision: no significant drainage  Lab Results:  Recent Labs    08/15/21 0343  WBC 12.1*  HGB 14.0  HCT 41.5  PLT 164   BMET Recent Labs    08/15/21 0343  NA 136  K 4.5  CL 101  CO2 28  GLUCOSE 136*  BUN 11  CREATININE 1.04  CALCIUM 8.3*   PT/INR No results for input(s): LABPROT, INR in the last 72 hours. ABG No results for input(s): PHART, HCO3 in the last 72 hours.  Invalid input(s): PCO2, PO2  MEDS, Scheduled  acetaminophen  1,000 mg Oral Q6H   alvimopan  12 mg Oral BID   enoxaparin (LOVENOX) injection  40 mg Subcutaneous Q24H   feeding supplement  237 mL Oral BID BM   gabapentin  300 mg Oral BID   loratadine  10 mg Oral Daily   rosuvastatin  20 mg Oral Daily   saccharomyces boulardii  250 mg Oral BID    Studies/Results: No results found.  Assessment: s/p Procedure(s): XI ROBOT ASSISTED LAPAROSCOPIC PARTIAL COLECTOMY Patient Active Problem List   Diagnosis Date Noted   Appendiceal tumor 08/14/2021   Numbness and tingling 12/17/2020   Chronic bilateral low back pain without sciatica 12/17/2020   Left hip pain 12/17/2020    Expected post op course  Plan: d/c foley Advance diet to soft foods SL IVFs Cont to ambulate   LOS: 1 day     .Rosario Adie, MD [facility]  Surgery, [place]    08/15/2021 7:45 AM

## 2021-08-15 NOTE — Discharge Instructions (Addendum)
ABDOMINAL SURGERY: POST OP INSTRUCTIONS  DIET: Follow a light bland diet the first 24 hours after arrival home, such as soup, liquids, crackers, etc.  Be sure to include lots of fluids daily.  Avoid fast food or heavy meals as your are more likely to get nauseated.  Do not eat any uncooked fruits or vegetables for the next 2 weeks as your colon heals. Take your usually prescribed home medications unless otherwise directed. PAIN CONTROL: Pain is best controlled by a usual combination of three different methods TOGETHER: Ice/Heat Over the counter pain medication Prescription pain medication Most patients will experience some swelling and bruising around the incisions.  Ice packs or heating pads (30-60 minutes up to 6 times a day) will help. Use ice for the first few days to help decrease swelling and bruising, then switch to heat to help relax tight/sore spots and speed recovery.  Some people prefer to use ice alone, heat alone, alternating between ice & heat.  Experiment to what works for you.  Swelling and bruising can take several weeks to resolve.   It is helpful to take an over-the-counter pain medication regularly for the first few weeks.  Choose one of the following that works best for you: Naproxen (Aleve, etc)  Two 220mg  tabs twice a day Ibuprofen (Advil, etc) Three 200mg  tabs four times a day (every meal & bedtime) Acetaminophen (Tylenol, etc) 500-650mg  four times a day (every meal & bedtime) A  prescription for pain medication (such as oxycodone, hydrocodone, etc) should be given to you upon discharge.  Take your pain medication as prescribed.  If you are having problems/concerns with the prescription medicine (does not control pain, nausea, vomiting, rash, itching, etc), please call us [phone] to see if we need to switch you to a different pain medicine that will work better for you and/or control your side effect better. If you need a refill on your pain medication, please contact  your pharmacy.  They will contact our office to request authorization. Prescriptions will not be filled after 5 pm or on week-ends. Avoid getting constipated.  Between the surgery and the pain medications, it is common to experience some constipation.  Increasing fluid intake and taking a fiber supplement (such as Metamucil, Citrucel, FiberCon, MiraLax, etc) 1-2 times a day regularly will usually help prevent this problem from occurring.  A mild laxative (prune juice, Milk of Magnesia, MiraLax, etc) should be taken according to package directions if there are no bowel movements after 48 hours.   Watch out for diarrhea.  If you have many loose bowel movements, simplify your diet to bland foods & liquids for a few days.  Stop any stool softeners and decrease your fiber supplement.  Switching to mild anti-diarrheal medications (Kayopectate, Pepto Bismol) can help.  If this worsens or does not improve, please call us. Wash / shower every day.  You may shower over the incision / wound.  Avoid baths until the skin is fully healed.  Continue to shower over incision(s) after the dressing is off. Remove your waterproof bandages 5 days after surgery.  You may leave the incision open to air.  You may replace a dressing/Band-Aid to cover the incision for comfort if you wish. ACTIVITIES as tolerated:   You may resume regular (light) daily activities beginning the next day--such as daily self-care, walking, climbing stairs--gradually increasing activities as tolerated.  If you can walk 30 minutes without difficulty, it is safe to try more intense activity such as jogging,  treadmill, bicycling, low-impact aerobics, swimming, etc. Save the most intensive and strenuous activity for last such as sit-ups, heavy lifting, contact sports, etc  Refrain from any heavy lifting or straining until you are off narcotics for pain control.   DO NOT PUSH THROUGH PAIN.  Let pain be your guide: If it hurts to do something, don't do it.   Pain is your body warning you to avoid that activity for another week until the pain goes down. You may drive when you are no longer taking prescription pain medication, you can comfortably wear a seatbelt, and you can safely maneuver your car and apply brakes. You may have sexual intercourse when it is comfortable.  FOLLOW UP in our office Please call [facility] at [phone] to set up an appointment to see your surgeon in the office for a follow-up appointment approximately 1-2 weeks after your surgery. Make sure that you call for this appointment the day you arrive home to insure a convenient appointment time. 10. IF YOU HAVE DISABILITY OR FAMILY LEAVE FORMS, BRING THEM TO THE OFFICE FOR PROCESSING.  DO NOT GIVE THEM TO YOUR DOCTOR.   WHEN TO CALL us [phone]: Poor pain control Reactions / problems with new medications (rash/itching, nausea, etc)  Fever over 101.5 F (38.5 C) Inability to urinate Nausea and/or vomiting Worsening swelling or bruising Continued bleeding from incision. Increased pain, redness, or drainage from the incision  The clinic staff is available to answer your questions during regular business hours (8:30am-5pm).  Please dont hesitate to call and ask to speak to one of our nurses for clinical concerns.   A surgeon from [facility] is always on call at the hospitals   If you have a medical emergency, go to the nearest emergency room or call 911.    [facility], [place], [place], [place], [place]  [address] ? MAIN: [phone] ? TOLL FREE: [phone] ? FAX [phone] [email or web address]  PER MEDICAL ONCOLOGY: Follow up with Dr. Benay Spice on 08/21/21 at 0830 to review pathology and discuss follow up.

## 2021-08-15 NOTE — Discharge Planning (Signed)
Oncology Discharge Planning Note  [facility] Address: [address] [place], [place], [place] [address] Hours of Operation:  Nena Polio, Monday - Friday  Clinic Contact Information:  [phone]) [phone]  Oncology Care Team: Medical Oncologist:  Dr. Betsy Coder  Patient Details: Name:  Stephen Carey, Stephen Carey MRN:   [id number] DOB:   04/26/1956 Reason for Current Admission: Appendiceal tumor  Discharge Planning Narrative: Office aware of admission of [name].  Discharge follow-up appointments for oncology are current and available on the AVS and MyChart.   Upon discharge from the hospital, hematology/oncology's post discharge plan of care for the outpatient setting is: Return to clinic on 08/21/21 to review pathology and needed follow up   [name] will be called within two business days after discharge to review hematology/oncology's plan of care for full understanding.    Outpatient Oncology Specific Care Only: Oncology appointment transportation needs addressed?:  yes-wife Oncology medication management for symptom management addressed?:  yes Chemo Alert Card reviewed?:  not applicable Immunotherapy Alert Card reviewed?:  not applicable

## 2021-08-16 LAB — CBC
HCT: 42.1 % (ref 39.0–52.0)
Hemoglobin: 14 g/dL (ref 13.0–17.0)
MCH: 32.9 pg (ref 26.0–34.0)
MCHC: 33.3 g/dL (ref 30.0–36.0)
MCV: 98.8 fL (ref 80.0–100.0)
Platelets: 167 10*3/uL (ref 150–400)
RBC: 4.26 MIL/uL (ref 4.22–5.81)
RDW: 13 % (ref 11.5–15.5)
WBC: 10.2 10*3/uL (ref 4.0–10.5)
nRBC: 0 % (ref 0.0–0.2)

## 2021-08-16 LAB — BASIC METABOLIC PANEL
Anion gap: 6 (ref 5–15)
BUN: 14 mg/dL (ref 8–23)
CO2: 27 mmol/L (ref 22–32)
Calcium: 7.9 mg/dL — ABNORMAL LOW (ref 8.9–10.3)
Chloride: 101 mmol/L (ref 98–111)
Creatinine, Ser: 1.19 mg/dL (ref 0.61–1.24)
GFR, Estimated: >60 mL/min (ref >60)
Glucose, Bld: 93 mg/dL (ref 70–99)
Potassium: 3.7 mmol/L (ref 3.5–5.1)
Sodium: 134 mmol/L — ABNORMAL LOW (ref 135–145)

## 2021-08-16 LAB — SURGICAL PATHOLOGY

## 2021-08-16 NOTE — Progress Notes (Signed)
Pharmacy Brief Note - Alvimopan (Entereg)  The standing order set for alvimopan (Entereg) now includes an automatic order to discontinue the drug after the patient has had a bowel movement.  The change was approved by the [place] and the Medical Executive Committee.    This patient has had a bowel movement documented by nursing.  Therefore, alvimopan has been discontinued.  If there are questions, please contact the pharmacy at [phone].  Thank you  Gretta Arab PharmD, BCPS 08/16/2021 10:07 AM

## 2021-08-16 NOTE — Plan of Care (Signed)
  Problem: Education: Goal: Knowledge of General Education information will improve Description: Including pain rating scale, medication(s)/side effects and non-pharmacologic comfort measures Outcome: Progressing   Problem: Clinical Measurements: Goal: Will remain free from infection Outcome: Progressing   Problem: Clinical Measurements: Goal: Diagnostic test results will improve Outcome: Progressing   

## 2021-08-16 NOTE — Discharge Summary (Signed)
Physician Discharge Summary  Patient ID: Stephen Carey MRN: [id number] DOB/AGE: 66/17/1957 66 y.o.  Admit date: 08/14/2021 Discharge date: 08/16/2021  Admission Diagnoses:  Discharge Diagnoses:  Principal Problem:   Appendiceal tumor   Discharged Condition: good  Hospital Course: Patient was admitted to the med surg floor after surgery.  Diet was advanced as tolerated.  Patient began to have bowel function on postop day 1.  By postop day 2, he was tolerating a solid diet and pain was controlled with oral medications.  He was urinating without difficulty and ambulating without assistance.  Patient was felt to be in stable condition for discharge to home.   Consults: None  Significant Diagnostic Studies: labs: cbc, bmet  Treatments: IV hydration, analgesia: acetaminophen, and surgery: robotic R colectomy  Discharge Exam: Blood pressure 135/74, pulse 81, temperature 98.1 F (36.7 C), temperature source Oral, resp. rate 16, height 6' (1.829 m), weight 104.9 kg, SpO2 96 %. General appearance: alert and cooperative GI: normal findings: soft, nondistended Incision/Wound: clean, dry, intact  Disposition: Discharge disposition: 01-Home or Self Care        Allergies as of 08/16/2021       Reactions   Sulfa Antibiotics Hives   rash   Atorvastatin    myalgias   Doxycycline    REACTION: severe diarrhea   Oxycodone-acetaminophen    GI upset Other reaction(s): GI upset   Penicillin G    Other reaction(s): unknown   Penicillins    REACTION: as child   Sulfonamide Derivatives    REACTION: rash, nausea        Medication List     TAKE these medications    acetaminophen 650 MG CR tablet Commonly known as: TYLENOL Take 1,300 mg by mouth every 8 (eight) hours as needed for pain.   aspirin EC 81 MG tablet Take 81 mg by mouth daily. Swallow whole.   fluticasone 50 MCG/ACT nasal spray Commonly known as: FLONASE Place 1 spray into both nostrils daily as needed for  allergies.   gabapentin 100 MG capsule Commonly known as: Neurontin Take 1 capsule (100 mg total) by mouth 3 (three) times daily as needed.   IBgard 90 MG Cpcr Generic drug: Peppermint Oil Take 90 mg by mouth daily as needed (IBS).   loperamide 2 MG capsule Commonly known as: IMODIUM Take 2 mg by mouth 4 (four) times daily as needed for diarrhea or loose stools.   loratadine 10 MG tablet Commonly known as: CLARITIN Take 10 mg by mouth daily.   rosuvastatin 20 MG tablet Commonly known as: CRESTOR Take 20 mg by mouth daily.   sildenafil 20 MG tablet Commonly known as: REVATIO Take 40-100 mg by mouth daily as needed (erectile dysfunction).   traMADol 50 MG tablet Commonly known as: ULTRAM Take 1-2 tablets (50-100 mg total) by mouth every 6 (six) hours as needed. What changed: how much to take        Follow-up Information     Leighton Ruff, MD. Schedule an appointment as soon as possible for a visit in 2 week(s).   Specialties: General Surgery, Colon and Rectal Surgery Contact information: [place] [address] [phone]                 Signed: Rosario Adie 5/46/5035, 1:10 PM

## 2021-08-19 ENCOUNTER — Telehealth: Payer: Self-pay | Admitting: *Deleted

## 2021-08-19 NOTE — Telephone Encounter (Signed)
Stephen Carey was contacted by telephone to verify understanding of discharge instructions status post their most recent discharge from the hospital on the date:  08/16/21.  Inpatient discharge AVS was re-reviewed with patient, along with cancer center appointments.  Verification of understanding for oncology specific follow-up was validated using the Teach Back method.    Transportation to appointments were confirmed for the patient as being self/caregiver.  [name] questions were addressed to their satisfaction upon completion of this post discharge follow-up call for outpatient oncology.

## 2021-08-21 ENCOUNTER — Inpatient Hospital Stay: Payer: Medicare HMO | Attending: Oncology | Admitting: Oncology

## 2021-08-21 ENCOUNTER — Telehealth: Payer: Self-pay | Admitting: Pharmacy Technician

## 2021-08-21 ENCOUNTER — Encounter: Payer: Self-pay | Admitting: *Deleted

## 2021-08-21 ENCOUNTER — Other Ambulatory Visit: Payer: Self-pay

## 2021-08-21 ENCOUNTER — Other Ambulatory Visit (HOSPITAL_COMMUNITY): Payer: Self-pay

## 2021-08-21 DIAGNOSIS — C181 Malignant neoplasm of appendix: Secondary | ICD-10-CM | POA: Insufficient documentation

## 2021-08-21 DIAGNOSIS — Z8616 Personal history of COVID-19: Secondary | ICD-10-CM | POA: Insufficient documentation

## 2021-08-21 DIAGNOSIS — C7801 Secondary malignant neoplasm of right lung: Secondary | ICD-10-CM | POA: Diagnosis not present

## 2021-08-21 DIAGNOSIS — R21 Rash and other nonspecific skin eruption: Secondary | ICD-10-CM | POA: Diagnosis not present

## 2021-08-21 DIAGNOSIS — Z8711 Personal history of peptic ulcer disease: Secondary | ICD-10-CM | POA: Diagnosis not present

## 2021-08-21 DIAGNOSIS — N400 Enlarged prostate without lower urinary tract symptoms: Secondary | ICD-10-CM | POA: Insufficient documentation

## 2021-08-21 MED ORDER — CAPECITABINE 500 MG PO TABS
ORAL_TABLET | ORAL | 0 refills | Status: DC
  Filled 2021-08-21: qty 112, fill #0

## 2021-08-21 NOTE — Progress Notes (Signed)
PATIENT NAVIGATOR PROGRESS NOTE  Name: Stephen Carey Date: 08/21/2021 MRN: [id number]  DOB: 04/06/1956   Reason for visit:  F/U visit with Dr Benay Spice after surgery  Comments:  Met with [name] to discuss treatment with Capecitabine 21 day cycle for 6 months. We discussed how to take, how to handle, when to take and to take consistently same time twice a day. We discussed possible side effects such as immunosuppression, fatigue, diarrhea management, prevention of mouth sores and hand/foot syndrome as well as wearing sunscreen.  He will RTC on 09/09/21 and begin treatment that day after visit with Dr Benay Spice.  Verbalized understanding, given written information, teach back method utilized  Given contact information to call with any questions or issues     Time spent counseling/coordinating care: > 60 minutes

## 2021-08-21 NOTE — Telephone Encounter (Signed)
Oral Oncology Patient Advocate Encounter   Received notification from [facility] that prior authorization for Xeloda is required.   PA submitted over the phone Status is APPROVED under part B benefit Approval dates: 08/21/21-08/21/22   Copay is $437.89.  Oral Oncology Clinic will continue to follow.  Index Patient Stephen Carey Phone [phone] Fax [phone] 08/21/2021 4:12 PM

## 2021-08-21 NOTE — Progress Notes (Signed)
°  Stephen Carey was taken to  the operating room by Dr. Marcello Moores on 08/14/2021 for a right colectomy.  Inflammatory adhesions were noted in the right lower quadrant.  No evidence of metastatic disease. He was discharged to home 08/16/2021.  His bowels are functioning.  He has pain in the right flank region when taking a deep breath.  No fever or dyspnea.  Objective:  Vital signs in last 24 hours:  Blood pressure (!) 161/78, pulse 80, temperature 97.8 F (36.6 C), temperature source Oral, resp. rate 18, height 6' (1.829 m), weight 217 lb 9.6 oz (98.7 kg), SpO2 100 %.    Resp: Lungs clear bilaterally, no respiratory distress Cardio: Regular rate and rhythm GI: Healed surgical incisions, no hepatosplenomegaly Vascular: No leg edema     Lab Results:  Lab Results  Component Value Date   WBC 10.2 08/16/2021   HGB 14.0 08/16/2021   HCT 42.1 08/16/2021   MCV 98.8 08/16/2021   PLT 167 08/16/2021    CMP  Lab Results  Component Value Date   NA 134 (L) 08/16/2021   K 3.7 08/16/2021   CL 101 08/16/2021   CO2 27 08/16/2021   GLUCOSE 93 08/16/2021   BUN 14 08/16/2021   CREATININE 1.19 08/16/2021   CALCIUM 7.9 (L) 08/16/2021   GFRNONAA >60 08/16/2021    Lab Results  Component Value Date   CEA1 3.0 08/14/2021    Medications: I have reviewed the patient's current medications.   Assessment/Plan: Goblet cell adenocarcinoma the appendix, stage IIb(pT4a,pN0) Appendectomy 06/26/2021, 2 x 1.2 x 1 cm grade 3 goblet cell adenocarcinoma, acellular mucin invades the visceral peritoneum (pT4a), perineural invasion present, proximal margin involved by invasive carcinoma CT abdomen/pelvis 06/03/2021-newly dilated and mildly thick-walled appendix without significant periappendiceal inflammatory changes, mild sigmoid diverticulosis, evolving postinfectious/inflammatory change in the medial basilar  right lower lobe CT chest 07/15/2021-no evidence of metastatic disease, small nodules at the right lung base-1 appears inflammatory, one 4 mm perifissural nodule Right colectomy 08/14/2021-no residual malignancy, 24 negative lymph nodes History of peptic ulcer disease BPH Bilateral inguinal hernia repair Left leg superficial phlebitis November 2022 COVID-15 April 2021    Disposition: Stephen Carey has been diagnosed with adenocarcinoma the appendix.  He has a pathologic stage IIb tumor.  We discussed the prognosis and adjuvant treatment options.  He has a good prognosis for long-term disease-free survival.  High risk features in his tumor include the T4 depth of invasion and perineural invasion. We discussed the expected benefit of adjuvant 5-fluorouracil based chemotherapy in patients with resected colon cancer.  We discussed the small expected absolute benefit from adjuvant chemotherapy in his case.  I recommend adjuvant capecitabine.  He agrees.  We reviewed potential toxicities associated with capecitabine including the chance of nausea, mucositis, diarrhea, alopecia, and hematologic toxicity.  We discussed the rash, sun sensitivity, hyperpigmentation, and hand/foot syndrome associated with capecitabine.  We discussed the potential for cardiac toxicity with capecitabine.  A prescription for capecitabine was entered today.  The plan is to begin capecitabine 09/09/2021.  He will return for an office visit 09/09/2021.    Stephen Coder, MD  08/21/2021  9:24 AM

## 2021-08-22 ENCOUNTER — Encounter: Payer: Self-pay | Admitting: Pharmacist

## 2021-08-22 ENCOUNTER — Telehealth: Payer: Self-pay | Admitting: Pharmacy Technician

## 2021-08-22 ENCOUNTER — Telehealth: Payer: Self-pay | Admitting: Pharmacist

## 2021-08-22 ENCOUNTER — Other Ambulatory Visit (HOSPITAL_COMMUNITY): Payer: Self-pay

## 2021-08-22 DIAGNOSIS — C181 Malignant neoplasm of appendix: Secondary | ICD-10-CM

## 2021-08-22 MED ORDER — CAPECITABINE 500 MG PO TABS
2000.0000 mg | ORAL_TABLET | Freq: Two times a day (BID) | ORAL | 0 refills | Status: DC
  Filled 2021-08-22: qty 112, 14d supply, fill #0
  Filled 2021-09-04: qty 112, 21d supply, fill #0

## 2021-08-22 NOTE — Telephone Encounter (Signed)
Encounter opened in error

## 2021-08-22 NOTE — Telephone Encounter (Signed)
Oral Oncology Pharmacist Encounter  Received new prescription for Xeloda (capecitabine) for the treatment of stage IIb Goblet cell adenocarcinoma the appendix, planned duration of 6 months. Expected start date 09/09/21.  BMP from 08/16/21 assessed, no relevant lab abnormalities. Prescription dose and frequency assessed.   Current medication list in Epic reviewed, no relevant DDIs with capecitabine identified.  Evaluated chart and no patient barriers to medication adherence identified.   Prescription has been e-scribed to the [facility] for benefits analysis and approval.  Oral Oncology Clinic will continue to follow for insurance authorization, copayment issues, initial counseling and start date.  Patient agreed to treatment on 08/21/21 per MD documentation.  Darl Pikes, PharmD, BCPS, BCOP, CPP Hematology/Oncology Clinical Pharmacist Practitioner [place]/DB/AP [facility] [facility] [phone]  08/22/2021 2:22 PM

## 2021-08-27 ENCOUNTER — Encounter: Payer: Self-pay | Admitting: *Deleted

## 2021-08-27 DIAGNOSIS — D239 Other benign neoplasm of skin, unspecified: Secondary | ICD-10-CM | POA: Diagnosis not present

## 2021-08-27 DIAGNOSIS — L590 Erythema ab igne [dermatitis ab igne]: Secondary | ICD-10-CM | POA: Diagnosis not present

## 2021-08-27 DIAGNOSIS — L570 Actinic keratosis: Secondary | ICD-10-CM | POA: Diagnosis not present

## 2021-08-27 NOTE — Progress Notes (Signed)
PATIENT NAVIGATOR PROGRESS NOTE  Name: Stephen Carey Date: 08/27/2021 MRN: [id number]  DOB: [birth year]   Reason for visit:  Telephone F/U after pre treatment discussion  Comments:  Spoke with pt via phone fore pre treatment discussion, he has spoken with Oral Oncology team and they are assisting him with options for copayment assistance We discussed possible side effects such as immunosuppression,fatigue, diarrhea, hand/foot syndrome and mouth sores. We discussed about management and prevention of side effects. Biotene mouthwast, Udderly Smooth Extra care 20 cream for hands and feet. He discussed returning to work on 09/10/21 and we discussed the importance of energy conservation.   Encouraged pt to call with any issues or questions    Time spent counseling/coordinating care: 30-45 minutes

## 2021-08-28 ENCOUNTER — Other Ambulatory Visit (HOSPITAL_COMMUNITY): Payer: Self-pay

## 2021-08-28 DIAGNOSIS — I83893 Varicose veins of bilateral lower extremities with other complications: Secondary | ICD-10-CM | POA: Diagnosis not present

## 2021-08-28 DIAGNOSIS — I8312 Varicose veins of left lower extremity with inflammation: Secondary | ICD-10-CM | POA: Diagnosis not present

## 2021-08-28 DIAGNOSIS — M7989 Other specified soft tissue disorders: Secondary | ICD-10-CM | POA: Diagnosis not present

## 2021-08-28 NOTE — Telephone Encounter (Signed)
Oral Oncology Patient Advocate Encounter   Was successful in securing patient an $68 grant from Patient [employer] [place] [facility]) to provide copayment coverage for Xeloda.  This will keep the out of pocket expense at $0.     I have spoken with the patient.    The billing information is as follows and has been shared with [place].   Member ID: [id number] Group ID: [id number] RxBin: [id number] Dates of Eligibility: 05/24/21 through 08/21/22  Fund:  [place] Patient [place] Phone [phone] Fax [phone] 08/28/2021 9:22 AM

## 2021-09-04 ENCOUNTER — Telehealth: Payer: Self-pay | Admitting: Pharmacist

## 2021-09-04 ENCOUNTER — Encounter: Payer: Self-pay | Admitting: Pharmacist

## 2021-09-04 ENCOUNTER — Other Ambulatory Visit (HOSPITAL_COMMUNITY): Payer: Self-pay

## 2021-09-04 NOTE — Telephone Encounter (Signed)
Oral Chemotherapy Pharmacist Encounter  Stephen Carey will deliver his capecitabine on 09/05/21. He knows the plan is to get started on 09/09/21.  Patient Education I spoke with patient for overview of new oral chemotherapy medication:  Xeloda (capecitabine) for the treatment of stage IIb Goblet cell adenocarcinoma the appendix, planned duration of 6 months. Expected start date 09/09/21  Counseled patient on administration, dosing, side effects, monitoring, drug-food interactions, safe handling, storage, and disposal. Patient will take 4 tablets (2,000 mg total) by mouth 2 (two) times daily after a meal. Take for 14 days, then hold for 7 days. Repeat every 21 days.  Side effects include but not limited to: diarrhea, hand-foot syndrome, mouth sores, edema, decreased wbc, fatigue, N/V Diarrhea: patient will check to make sure he has loperamide on hand Hand-foot syndrome: Recommended the use of Udderly Smooth Extra Care 20 Mouth sores: Instructed patient to call for magic mouthwash if mouth sores occur  Reviewed with patient importance of keeping a medication schedule and plan for any missed doses.  After discussion with patient no patient barriers to medication adherence identified.   Stephen Carey voiced understanding and appreciation. All questions answered. Medication handout provided.  Provided patient with Oral [facility] phone number. Patient knows to call the office with questions or concerns. Oral Chemotherapy Navigation Clinic will continue to follow.  Darl Pikes, PharmD, BCPS, BCOP, CPP Hematology/Oncology Clinical Pharmacist Practitioner [place]/DB/AP [facility] [place] [facility] [phone]  09/04/2021 9:55 AM

## 2021-09-09 ENCOUNTER — Telehealth: Payer: Self-pay

## 2021-09-09 ENCOUNTER — Inpatient Hospital Stay: Payer: Medicare HMO | Admitting: Nurse Practitioner

## 2021-09-09 ENCOUNTER — Inpatient Hospital Stay: Payer: Medicare HMO | Attending: Oncology

## 2021-09-09 ENCOUNTER — Encounter: Payer: Self-pay | Admitting: Nurse Practitioner

## 2021-09-09 ENCOUNTER — Other Ambulatory Visit: Payer: Self-pay

## 2021-09-09 VITALS — BP 161/85 | HR 71 | Temp 97.8°F | Resp 18 | Ht 72.0 in | Wt 224.8 lb

## 2021-09-09 DIAGNOSIS — Z79899 Other long term (current) drug therapy: Secondary | ICD-10-CM | POA: Diagnosis not present

## 2021-09-09 DIAGNOSIS — N400 Enlarged prostate without lower urinary tract symptoms: Secondary | ICD-10-CM | POA: Diagnosis not present

## 2021-09-09 DIAGNOSIS — C181 Malignant neoplasm of appendix: Secondary | ICD-10-CM | POA: Diagnosis not present

## 2021-09-09 DIAGNOSIS — Z8711 Personal history of peptic ulcer disease: Secondary | ICD-10-CM | POA: Insufficient documentation

## 2021-09-09 DIAGNOSIS — Z8616 Personal history of COVID-19: Secondary | ICD-10-CM | POA: Diagnosis not present

## 2021-09-09 LAB — CMP (CANCER CENTER ONLY)
ALT: 30 U/L (ref 0–44)
AST: 22 U/L (ref 15–41)
Albumin: 4.1 g/dL (ref 3.5–5.0)
Alkaline Phosphatase: 80 U/L (ref 38–126)
Anion gap: 8 (ref 5–15)
BUN: 15 mg/dL (ref 8–23)
CO2: 31 mmol/L (ref 22–32)
Calcium: 9.7 mg/dL (ref 8.9–10.3)
Chloride: 100 mmol/L (ref 98–111)
Creatinine: 1.06 mg/dL (ref 0.61–1.24)
GFR, Estimated: >60 mL/min (ref >60)
Glucose, Bld: 93 mg/dL (ref 70–99)
Potassium: 4.3 mmol/L (ref 3.5–5.1)
Sodium: 139 mmol/L (ref 135–145)
Total Bilirubin: 0.7 mg/dL (ref 0.3–1.2)
Total Protein: 7.4 g/dL (ref 6.5–8.1)

## 2021-09-09 LAB — CBC WITH DIFFERENTIAL (CANCER CENTER ONLY)
Abs Immature Granulocytes: 0.09 10*3/uL — ABNORMAL HIGH (ref 0.00–0.07)
Basophils Absolute: 0.1 10*3/uL (ref 0.0–0.1)
Basophils Relative: 1 %
Eosinophils Absolute: 0.2 10*3/uL (ref 0.0–0.5)
Eosinophils Relative: 3 %
HCT: 49.6 % (ref 39.0–52.0)
Hemoglobin: 16.3 g/dL (ref 13.0–17.0)
Immature Granulocytes: 1 %
Lymphocytes Relative: 27 %
Lymphs Abs: 2.2 10*3/uL (ref 0.7–4.0)
MCH: 31.2 pg (ref 26.0–34.0)
MCHC: 32.9 g/dL (ref 30.0–36.0)
MCV: 95 fL (ref 80.0–100.0)
Monocytes Absolute: 0.7 10*3/uL (ref 0.1–1.0)
Monocytes Relative: 9 %
Neutro Abs: 4.8 10*3/uL (ref 1.7–7.7)
Neutrophils Relative %: 59 %
Platelet Count: 180 10*3/uL (ref 150–400)
RBC: 5.22 MIL/uL (ref 4.22–5.81)
RDW: 12.3 % (ref 11.5–15.5)
WBC Count: 8.2 10*3/uL (ref 4.0–10.5)
nRBC: 0 % (ref 0.0–0.2)

## 2021-09-09 NOTE — Progress Notes (Signed)
°  [place] OFFICE PROGRESS NOTE   Diagnosis:  Appendix cancer  INTERVAL HISTORY:   Stephen Carey returns as scheduled.  He feels he is healed from surgery.  Bowels are moving.  Appetite is "normal".  He denies pain.  Objective:  Vital signs in last 24 hours:  Blood pressure (!) 161/85, pulse 71, temperature 97.8 F (36.6 C), temperature source Oral, resp. rate 18, height 6' (1.829 m), weight 224 lb 12.8 oz (102 kg), SpO2 100 %.    HEENT: No thrush or ulcers. Resp: Lungs clear bilaterally. Cardio: Regular rate and rhythm. GI: Abdomen soft and nontender.  No hepatomegaly. Vascular: No leg edema. Skin: Palms without erythema.  Callus left palm.   Lab Results:  Lab Results  Component Value Date   WBC 8.2 09/09/2021   HGB 16.3 09/09/2021   HCT 49.6 09/09/2021   MCV 95.0 09/09/2021   PLT 180 09/09/2021   NEUTROABS 4.8 09/09/2021    Imaging:  No results found.  Medications: I have reviewed the patient's current medications.  Assessment/Plan: Goblet cell adenocarcinoma the appendix, stage IIb(pT4a,pN0) Appendectomy 06/26/2021, 2 x 1.2 x 1 cm grade 3 goblet cell adenocarcinoma, acellular mucin invades the visceral peritoneum (pT4a), perineural invasion present, proximal margin involved by invasive carcinoma CT abdomen/pelvis 06/03/2021-newly dilated and mildly thick-walled appendix without significant periappendiceal inflammatory changes, mild sigmoid diverticulosis, evolving postinfectious/inflammatory change in the medial basilar right lower lobe CT chest 07/15/2021-no evidence of metastatic disease, small nodules at the right lung base-1 appears inflammatory, one 4 mm perifissural nodule Right colectomy 08/14/2021-no residual malignancy, 24 negative lymph nodes Cycle 1 adjuvant Xeloda 09/09/2021 History of peptic ulcer disease BPH Bilateral inguinal hernia repair Left leg superficial phlebitis November 2022 COVID-15 April 2021  Disposition: Stephen Carey  appears well.  He is scheduled to begin adjuvant Xeloda today.  We again reviewed potential toxicities.  He agrees to proceed.  Plan to go ahead with cycle 1 today as scheduled.  CBC reviewed.  Counts adequate to proceed as above.  He will return for lab and follow-up on 09/26/2021.  He will contact the office in the interim with any problems.    Ned Card ANP/GNP-BC   09/09/2021  8:50 AM

## 2021-09-09 NOTE — Telephone Encounter (Signed)
Called the patient to let him know his last colonoscopy was August 2020. Dr. Benay Spice recommends a colonoscopy in 1-2 years and Dr. Benay Spice will discuss colonoscopy with the patient at next office visit.

## 2021-09-12 ENCOUNTER — Encounter: Payer: Self-pay | Admitting: *Deleted

## 2021-09-12 NOTE — Progress Notes (Signed)
PATIENT NAVIGATOR PROGRESS NOTE  Name: Stephen Carey Date: 09/12/2021 MRN: [id number]  DOB: Feb 29, 1956   Reason for visit:  F/U after starting first cycle of Capecitabine  Comments:  Spoke with patient via phone after starting Capecitabine, cycle 1 on Monday.  He is tolerating well, normal bowel movements, normal energy level a little fatigued at end of day.  No nausea.  We discussed postponing superficial vein treatments until end of chemo regimen  We discussed upcoming appt with Dr Paulita Fujita in March to discuss timing of colonoscopy, Will discuss further with Dr Benay Spice at next appt    Time spent counseling/coordinating care: 45-60 minutes

## 2021-09-13 ENCOUNTER — Encounter: Payer: Self-pay | Admitting: *Deleted

## 2021-09-13 NOTE — Progress Notes (Signed)
PATIENT NAVIGATOR PROGRESS NOTE  Name: Stephen Carey Date: 09/13/2021 MRN: [id number]  DOB: 12-04-1955   Reason for visit:  Phone call from patient  Comments:  Patient called regarding a red rash on lower leg at shin with little red dots, he described painless and blanching. No fever, no diarrhea or other issues. He did say that he had gotten in a hot tub and had had this kind of rash when he was out in the heat before.  Encouraged him to monitor over the weekend to take warm showers not hot and call if it does not improve.  He will call on Monday with an update or sooner over the weekend if any other issues arise     Time spent counseling/coordinating care: 30-45 minutes

## 2021-09-17 ENCOUNTER — Other Ambulatory Visit (HOSPITAL_COMMUNITY): Payer: Self-pay

## 2021-09-17 ENCOUNTER — Other Ambulatory Visit: Payer: Self-pay | Admitting: Oncology

## 2021-09-17 DIAGNOSIS — C181 Malignant neoplasm of appendix: Secondary | ICD-10-CM

## 2021-09-17 NOTE — Telephone Encounter (Signed)
Will refill at 3/3 MD visit to confirm the 1st cycle was well tolerated.

## 2021-09-19 ENCOUNTER — Other Ambulatory Visit (HOSPITAL_COMMUNITY): Payer: Self-pay

## 2021-09-20 ENCOUNTER — Other Ambulatory Visit (HOSPITAL_COMMUNITY): Payer: Self-pay

## 2021-09-23 ENCOUNTER — Other Ambulatory Visit: Payer: Self-pay | Admitting: Oncology

## 2021-09-23 ENCOUNTER — Other Ambulatory Visit (HOSPITAL_COMMUNITY): Payer: Self-pay

## 2021-09-23 DIAGNOSIS — C181 Malignant neoplasm of appendix: Secondary | ICD-10-CM

## 2021-09-24 ENCOUNTER — Other Ambulatory Visit (HOSPITAL_COMMUNITY): Payer: Self-pay

## 2021-09-25 DIAGNOSIS — Z23 Encounter for immunization: Secondary | ICD-10-CM | POA: Diagnosis not present

## 2021-09-25 DIAGNOSIS — M159 Polyosteoarthritis, unspecified: Secondary | ICD-10-CM | POA: Diagnosis not present

## 2021-09-25 DIAGNOSIS — N400 Enlarged prostate without lower urinary tract symptoms: Secondary | ICD-10-CM | POA: Diagnosis not present

## 2021-09-25 DIAGNOSIS — G894 Chronic pain syndrome: Secondary | ICD-10-CM | POA: Diagnosis not present

## 2021-09-25 DIAGNOSIS — Z0000 Encounter for general adult medical examination without abnormal findings: Secondary | ICD-10-CM | POA: Diagnosis not present

## 2021-09-25 DIAGNOSIS — C181 Malignant neoplasm of appendix: Secondary | ICD-10-CM | POA: Diagnosis not present

## 2021-09-25 DIAGNOSIS — E782 Mixed hyperlipidemia: Secondary | ICD-10-CM | POA: Diagnosis not present

## 2021-09-25 DIAGNOSIS — M47816 Spondylosis without myelopathy or radiculopathy, lumbar region: Secondary | ICD-10-CM | POA: Diagnosis not present

## 2021-09-26 ENCOUNTER — Inpatient Hospital Stay: Payer: Medicare HMO | Attending: Oncology

## 2021-09-26 ENCOUNTER — Encounter: Payer: Self-pay | Admitting: Oncology

## 2021-09-26 ENCOUNTER — Inpatient Hospital Stay: Payer: Medicare HMO | Admitting: Oncology

## 2021-09-26 ENCOUNTER — Other Ambulatory Visit: Payer: Self-pay

## 2021-09-26 ENCOUNTER — Ambulatory Visit (HOSPITAL_BASED_OUTPATIENT_CLINIC_OR_DEPARTMENT_OTHER)
Admission: RE | Admit: 2021-09-26 | Discharge: 2021-09-26 | Disposition: A | Discharge status: Home / Self Care | Payer: Medicare HMO | Source: Ambulatory Visit | Attending: Oncology | Admitting: Oncology

## 2021-09-26 ENCOUNTER — Other Ambulatory Visit (HOSPITAL_COMMUNITY): Payer: Self-pay

## 2021-09-26 VITALS — BP 129/74 | HR 68 | Temp 97.8°F | Resp 20 | Ht 72.0 in | Wt 224.6 lb

## 2021-09-26 DIAGNOSIS — C181 Malignant neoplasm of appendix: Secondary | ICD-10-CM

## 2021-09-26 DIAGNOSIS — R600 Localized edema: Secondary | ICD-10-CM | POA: Diagnosis not present

## 2021-09-26 LAB — CMP (CANCER CENTER ONLY)
ALT: 17 U/L (ref 0–44)
AST: 19 U/L (ref 15–41)
Albumin: 4.1 g/dL (ref 3.5–5.0)
Alkaline Phosphatase: 56 U/L (ref 38–126)
Anion gap: 6 (ref 5–15)
BUN: 17 mg/dL (ref 8–23)
CO2: 30 mmol/L (ref 22–32)
Calcium: 9 mg/dL (ref 8.9–10.3)
Chloride: 104 mmol/L (ref 98–111)
Creatinine: 1.06 mg/dL (ref 0.61–1.24)
GFR, Estimated: >60 mL/min (ref >60)
Glucose, Bld: 99 mg/dL (ref 70–99)
Potassium: 4.4 mmol/L (ref 3.5–5.1)
Sodium: 140 mmol/L (ref 135–145)
Total Bilirubin: 0.9 mg/dL (ref 0.3–1.2)
Total Protein: 7 g/dL (ref 6.5–8.1)

## 2021-09-26 LAB — CBC WITH DIFFERENTIAL (CANCER CENTER ONLY)
Abs Immature Granulocytes: 0.04 10*3/uL (ref 0.00–0.07)
Basophils Absolute: 0.1 10*3/uL (ref 0.0–0.1)
Basophils Relative: 1 %
Eosinophils Absolute: 0.2 10*3/uL (ref 0.0–0.5)
Eosinophils Relative: 3 %
HCT: 42.3 % (ref 39.0–52.0)
Hemoglobin: 14.1 g/dL (ref 13.0–17.0)
Immature Granulocytes: 1 %
Lymphocytes Relative: 26 %
Lymphs Abs: 2.1 10*3/uL (ref 0.7–4.0)
MCH: 32 pg (ref 26.0–34.0)
MCHC: 33.3 g/dL (ref 30.0–36.0)
MCV: 96.1 fL (ref 80.0–100.0)
Monocytes Absolute: 0.7 10*3/uL (ref 0.1–1.0)
Monocytes Relative: 9 %
Neutro Abs: 4.7 10*3/uL (ref 1.7–7.7)
Neutrophils Relative %: 60 %
Platelet Count: 199 10*3/uL (ref 150–400)
RBC: 4.4 MIL/uL (ref 4.22–5.81)
RDW: 13.6 % (ref 11.5–15.5)
WBC Count: 7.9 10*3/uL (ref 4.0–10.5)
nRBC: 0 % (ref 0.0–0.2)

## 2021-09-26 MED FILL — Capecitabine Tab 500 MG: ORAL | 21 days supply | Qty: 112 | Fill #0 | Status: AC

## 2021-09-26 NOTE — Progress Notes (Signed)
?  [place] ?OFFICE PROGRESS NOTE ? ? ?Diagnosis: Appendix cancer ? ?INTERVAL HISTORY:  ? ?Stephen Carey completed cycle 1 adjuvant Xeloda beginning 09/09/2021.  No mouth sores, nausea, diarrhea, or hand/foot pain.  He is working.  He noted swelling of the right ankle he returned to work.  No leg pain. ? ?Objective: ? ?Vital signs in last 24 hours: ? ?Blood pressure 129/74, pulse 68, temperature 97.8 ?F (36.6 ?C), temperature source Oral, resp. rate 20, height 6' (1.829 m), weight 224 lb 9.6 oz (101.9 kg), SpO2 98 %. ?  ? ?HEENT: No thrush or ulcers ?Resp: Lungs clear bilaterally ?Cardio: Regular rate and rhythm ?GI: No hepatosplenomegaly ?Vascular: The right lower leg is slightly larger than the left side, no erythema or tenderness.  Bilateral lower extremity varicosities ? ?Lab Results: ? ?Lab Results  ?Component Value Date  ? WBC 7.9 09/26/2021  ? HGB 14.1 09/26/2021  ? HCT 42.3 09/26/2021  ? MCV 96.1 09/26/2021  ? PLT 199 09/26/2021  ? NEUTROABS 4.7 09/26/2021  ? ? ?CMP  ?Lab Results  ?Component Value Date  ? NA 139 09/09/2021  ? K 4.3 09/09/2021  ? CL 100 09/09/2021  ? CO2 31 09/09/2021  ? GLUCOSE 93 09/09/2021  ? BUN 15 09/09/2021  ? CREATININE 1.06 09/09/2021  ? CALCIUM 9.7 09/09/2021  ? PROT 7.4 09/09/2021  ? ALBUMIN 4.1 09/09/2021  ? AST 22 09/09/2021  ? ALT 30 09/09/2021  ? ALKPHOS 80 09/09/2021  ? BILITOT 0.7 09/09/2021  ? GFRNONAA >60 09/09/2021  ? ? ?Lab Results  ?Component Value Date  ? CEA1 3.0 08/14/2021  ? ? ?Medications: I have reviewed the patient's current medications. ? ? ?Assessment/Plan: ?Goblet cell adenocarcinoma the appendix, stage IIb(pT4a,pN0) ?Appendectomy 06/26/2021, 2 x 1.2 x 1 cm grade 3 goblet cell adenocarcinoma, acellular mucin invades the visceral peritoneum (pT4a), perineural invasion present, proximal margin involved by invasive carcinoma ?CT abdomen/pelvis 06/03/2021-newly dilated and mildly thick-walled appendix without significant periappendiceal inflammatory  changes, mild sigmoid diverticulosis, evolving postinfectious/inflammatory change in the medial basilar right lower lobe ?CT chest 07/15/2021-no evidence of metastatic disease, small nodules at the right lung base-1 appears inflammatory, one 4 mm perifissural nodule ?Right colectomy 08/14/2021-no residual malignancy, 24 negative lymph nodes ?Cycle 1 adjuvant Xeloda 09/09/2021 ?Cycle 2 adjuvant Xeloda 09/30/2021 ?History of peptic ulcer disease ?BPH ?Bilateral inguinal hernia repair ?Left leg superficial phlebitis November 2022 ?COVID-15 April 2021 ? ? ? ?Disposition: ?Mr. Yam tolerated the first cycle of adjuvant Xeloda well.  He will complete cycle 2 beginning 09/30/2021.  He has mild swelling of the right lower leg today.  This is likely secondary to varicosities.  He is at risk for venous thrombosis given previous history of left leg phlebitis, recent surgery, appendiceal cancer, and adjuvant chemotherapy.  I will refer him for Dopplers of the lower extremities. ? ?He will return for an office and lab visit in 3 weeks. ? ?Betsy Coder, MD ? ?09/26/2021  ?8:28 AM ? ? ?

## 2021-09-27 ENCOUNTER — Other Ambulatory Visit (HOSPITAL_COMMUNITY): Payer: Self-pay

## 2021-10-02 ENCOUNTER — Telehealth: Payer: Self-pay

## 2021-10-02 NOTE — Telephone Encounter (Signed)
TC from Pt stating he has a sinus infection and would like to know what to take. Per Dr Benay Spice Pt can take any decongestant over the counter. Pt inquired if he receives an antibiotic.Informed Pt to let us know what antibiotic that was prescribed. Pt verbalized understanding  ?

## 2021-10-03 DIAGNOSIS — Z03818 Encounter for observation for suspected exposure to other biological agents ruled out: Secondary | ICD-10-CM | POA: Diagnosis not present

## 2021-10-03 DIAGNOSIS — J0191 Acute recurrent sinusitis, unspecified: Secondary | ICD-10-CM | POA: Diagnosis not present

## 2021-10-04 ENCOUNTER — Encounter: Payer: Self-pay | Admitting: *Deleted

## 2021-10-04 NOTE — Progress Notes (Signed)
Patient called in to inform Dr Benay Spice that he is being treated with Azithromycin(Z pack) and Flonase for sinusitis and will start the treatment today. ?  Encouraged yogurt while on antibiotic to keep GI flora normalized.  ?

## 2021-10-08 ENCOUNTER — Other Ambulatory Visit: Payer: Self-pay | Admitting: Oncology

## 2021-10-08 ENCOUNTER — Other Ambulatory Visit (HOSPITAL_COMMUNITY): Payer: Self-pay

## 2021-10-08 DIAGNOSIS — C181 Malignant neoplasm of appendix: Secondary | ICD-10-CM

## 2021-10-10 ENCOUNTER — Other Ambulatory Visit (HOSPITAL_COMMUNITY): Payer: Self-pay

## 2021-10-11 ENCOUNTER — Other Ambulatory Visit (HOSPITAL_COMMUNITY): Payer: Self-pay

## 2021-10-11 MED FILL — Capecitabine Tab 500 MG: ORAL | 21 days supply | Qty: 112 | Fill #0 | Status: AC

## 2021-10-14 DIAGNOSIS — C181 Malignant neoplasm of appendix: Secondary | ICD-10-CM | POA: Diagnosis not present

## 2021-10-16 ENCOUNTER — Other Ambulatory Visit (HOSPITAL_COMMUNITY): Payer: Self-pay

## 2021-10-17 ENCOUNTER — Inpatient Hospital Stay: Payer: Medicare HMO

## 2021-10-17 ENCOUNTER — Other Ambulatory Visit: Payer: Self-pay

## 2021-10-17 ENCOUNTER — Inpatient Hospital Stay: Payer: Medicare HMO | Admitting: Oncology

## 2021-10-17 VITALS — BP 130/70 | HR 69 | Temp 97.8°F | Resp 20 | Ht 72.0 in | Wt 225.0 lb

## 2021-10-17 DIAGNOSIS — C181 Malignant neoplasm of appendix: Secondary | ICD-10-CM | POA: Diagnosis not present

## 2021-10-17 DIAGNOSIS — N400 Enlarged prostate without lower urinary tract symptoms: Secondary | ICD-10-CM | POA: Diagnosis not present

## 2021-10-17 DIAGNOSIS — Z9221 Personal history of antineoplastic chemotherapy: Secondary | ICD-10-CM | POA: Insufficient documentation

## 2021-10-17 DIAGNOSIS — R0609 Other forms of dyspnea: Secondary | ICD-10-CM | POA: Diagnosis not present

## 2021-10-17 DIAGNOSIS — R110 Nausea: Secondary | ICD-10-CM | POA: Insufficient documentation

## 2021-10-17 DIAGNOSIS — Z8616 Personal history of COVID-19: Secondary | ICD-10-CM | POA: Insufficient documentation

## 2021-10-17 DIAGNOSIS — Z8711 Personal history of peptic ulcer disease: Secondary | ICD-10-CM | POA: Diagnosis not present

## 2021-10-17 LAB — CBC WITH DIFFERENTIAL (CANCER CENTER ONLY)
Abs Immature Granulocytes: 0.06 10*3/uL (ref 0.00–0.07)
Basophils Absolute: 0.1 10*3/uL (ref 0.0–0.1)
Basophils Relative: 1 %
Eosinophils Absolute: 0.2 10*3/uL (ref 0.0–0.5)
Eosinophils Relative: 4 %
HCT: 41.1 % (ref 39.0–52.0)
Hemoglobin: 13.7 g/dL (ref 13.0–17.0)
Immature Granulocytes: 1 %
Lymphocytes Relative: 30 %
Lymphs Abs: 1.9 10*3/uL (ref 0.7–4.0)
MCH: 33 pg (ref 26.0–34.0)
MCHC: 33.3 g/dL (ref 30.0–36.0)
MCV: 99 fL (ref 80.0–100.0)
Monocytes Absolute: 0.6 10*3/uL (ref 0.1–1.0)
Monocytes Relative: 10 %
Neutro Abs: 3.4 10*3/uL (ref 1.7–7.7)
Neutrophils Relative %: 54 %
Platelet Count: 169 10*3/uL (ref 150–400)
RBC: 4.15 MIL/uL — ABNORMAL LOW (ref 4.22–5.81)
RDW: 16.3 % — ABNORMAL HIGH (ref 11.5–15.5)
WBC Count: 6.1 10*3/uL (ref 4.0–10.5)
nRBC: 0 % (ref 0.0–0.2)

## 2021-10-17 LAB — CMP (CANCER CENTER ONLY)
ALT: 24 U/L (ref 0–44)
AST: 22 U/L (ref 15–41)
Albumin: 4.2 g/dL (ref 3.5–5.0)
Alkaline Phosphatase: 58 U/L (ref 38–126)
Anion gap: 5 (ref 5–15)
BUN: 17 mg/dL (ref 8–23)
CO2: 32 mmol/L (ref 22–32)
Calcium: 9.2 mg/dL (ref 8.9–10.3)
Chloride: 102 mmol/L (ref 98–111)
Creatinine: 1.2 mg/dL (ref 0.61–1.24)
GFR, Estimated: >60 mL/min (ref >60)
Glucose, Bld: 96 mg/dL (ref 70–99)
Potassium: 4.2 mmol/L (ref 3.5–5.1)
Sodium: 139 mmol/L (ref 135–145)
Total Bilirubin: 0.9 mg/dL (ref 0.3–1.2)
Total Protein: 7.2 g/dL (ref 6.5–8.1)

## 2021-10-17 NOTE — Progress Notes (Signed)
?  [place] ?OFFICE PROGRESS NOTE ? ? ?Diagnosis: Appendix cancer ? ?INTERVAL HISTORY:  ? ?[name] returns as scheduled.  He completed another cycle of Xeloda beginning 09/30/2021.  No mouth sores, diarrhea, or hand/foot pain.  He reports mild nausea for the past 2 days.  He has mild exertional dyspnea.  He played golf several days last week.  No leg swelling.  Dopplers of the lower extremities on 09/26/2021 were negative for DVT. ? ?Objective: ? ?Vital signs in last 24 hours: ? ?Blood pressure 130/70, pulse 69, temperature 97.8 ?F (36.6 ?C), resp. rate 20, height 6' (1.829 m), weight 225 lb (102.1 kg), SpO2 100 %. ?  ? ?HEENT: No thrush or ulcers ?Resp: Lungs clear bilaterally ?Cardio: Regular rate and rhythm ?GI: No hepatosplenomegaly, nontender ?Vascular: No leg edema  ?Skin: Dryness and callus formation at the soles, dryness at the hands, no skin breakdown ? ?Lab Results: ? ?Lab Results  ?Component Value Date  ? WBC 6.1 10/17/2021  ? HGB 13.7 10/17/2021  ? HCT 41.1 10/17/2021  ? MCV 99.0 10/17/2021  ? PLT 169 10/17/2021  ? NEUTROABS 3.4 10/17/2021  ? ? ?CMP  ?Lab Results  ?Component Value Date  ? NA 140 09/26/2021  ? K 4.4 09/26/2021  ? CL 104 09/26/2021  ? CO2 30 09/26/2021  ? GLUCOSE 99 09/26/2021  ? BUN 17 09/26/2021  ? CREATININE 1.06 09/26/2021  ? CALCIUM 9.0 09/26/2021  ? PROT 7.0 09/26/2021  ? ALBUMIN 4.1 09/26/2021  ? AST 19 09/26/2021  ? ALT 17 09/26/2021  ? ALKPHOS 56 09/26/2021  ? BILITOT 0.9 09/26/2021  ? GFRNONAA >60 09/26/2021  ? ? ?Lab Results  ?Component Value Date  ? CEA1 3.0 08/14/2021  ? ? ?Medications: I have reviewed the patient's current medications. ? ? ?Assessment/Plan: ?Goblet cell adenocarcinoma the appendix, stage IIb(pT4a,pN0) ?Appendectomy 06/26/2021, 2 x 1.2 x 1 cm grade 3 goblet cell adenocarcinoma, acellular mucin invades the visceral peritoneum (pT4a), perineural invasion present, proximal margin involved by invasive carcinoma ?CT abdomen/pelvis 06/03/2021-newly  dilated and mildly thick-walled appendix without significant periappendiceal inflammatory changes, mild sigmoid diverticulosis, evolving postinfectious/inflammatory change in the medial basilar right lower lobe ?CT chest 07/15/2021-no evidence of metastatic disease, small nodules at the right lung base-1 appears inflammatory, one 4 mm perifissural nodule ?Right colectomy 08/14/2021-no residual malignancy, 24 negative lymph nodes ?Cycle 1 adjuvant Xeloda 09/09/2021 ?Cycle 2 adjuvant Xeloda 09/30/2021 ?Cycle 3 adjuvant Xeloda 10/21/2021 ?History of peptic ulcer disease ?BPH ?Bilateral inguinal hernia repair ?Left leg superficial phlebitis November 2022 ?COVID-15 April 2021 ? ? ? ? ? ?Disposition: ?[name] has completed 2 cycles of adjuvant Xeloda.  He is tolerating the chemotherapy well.  He will complete cycle 3 beginning 10/21/2021.  He will return for an office and lab visit in 3 weeks. ? ? ?Betsy Coder, MD ? ?10/17/2021  ?9:18 AM ? ? ?

## 2021-10-23 DIAGNOSIS — M545 Low back pain, unspecified: Secondary | ICD-10-CM | POA: Diagnosis not present

## 2021-10-29 ENCOUNTER — Other Ambulatory Visit (HOSPITAL_COMMUNITY): Payer: Self-pay

## 2021-10-29 ENCOUNTER — Other Ambulatory Visit: Payer: Self-pay | Admitting: Oncology

## 2021-10-29 DIAGNOSIS — C181 Malignant neoplasm of appendix: Secondary | ICD-10-CM

## 2021-10-29 MED ORDER — CAPECITABINE 500 MG PO TABS
2000.0000 mg | ORAL_TABLET | Freq: Two times a day (BID) | ORAL | 0 refills | Status: DC
  Filled 2021-10-29: qty 112, 21d supply, fill #0

## 2021-11-04 ENCOUNTER — Other Ambulatory Visit (HOSPITAL_COMMUNITY): Payer: Self-pay

## 2021-11-05 DIAGNOSIS — M545 Low back pain, unspecified: Secondary | ICD-10-CM | POA: Diagnosis not present

## 2021-11-11 ENCOUNTER — Inpatient Hospital Stay (HOSPITAL_BASED_OUTPATIENT_CLINIC_OR_DEPARTMENT_OTHER): Payer: Medicare HMO | Admitting: Oncology

## 2021-11-11 ENCOUNTER — Inpatient Hospital Stay: Payer: Medicare HMO | Attending: Oncology

## 2021-11-11 VITALS — BP 124/76 | HR 68 | Temp 98.2°F | Resp 18 | Ht 72.0 in | Wt 223.0 lb

## 2021-11-11 DIAGNOSIS — C181 Malignant neoplasm of appendix: Secondary | ICD-10-CM | POA: Diagnosis not present

## 2021-11-11 DIAGNOSIS — N400 Enlarged prostate without lower urinary tract symptoms: Secondary | ICD-10-CM | POA: Diagnosis not present

## 2021-11-11 DIAGNOSIS — Z8616 Personal history of COVID-19: Secondary | ICD-10-CM | POA: Diagnosis not present

## 2021-11-11 DIAGNOSIS — Z8711 Personal history of peptic ulcer disease: Secondary | ICD-10-CM | POA: Insufficient documentation

## 2021-11-11 LAB — CMP (CANCER CENTER ONLY)
ALT: 18 U/L (ref 0–44)
AST: 21 U/L (ref 15–41)
Albumin: 4.3 g/dL (ref 3.5–5.0)
Alkaline Phosphatase: 68 U/L (ref 38–126)
Anion gap: 7 (ref 5–15)
BUN: 17 mg/dL (ref 8–23)
CO2: 28 mmol/L (ref 22–32)
Calcium: 8.9 mg/dL (ref 8.9–10.3)
Chloride: 104 mmol/L (ref 98–111)
Creatinine: 1.04 mg/dL (ref 0.61–1.24)
GFR, Estimated: >60 mL/min (ref >60)
Glucose, Bld: 100 mg/dL — ABNORMAL HIGH (ref 70–99)
Potassium: 4 mmol/L (ref 3.5–5.1)
Sodium: 139 mmol/L (ref 135–145)
Total Bilirubin: 0.8 mg/dL (ref 0.3–1.2)
Total Protein: 7.2 g/dL (ref 6.5–8.1)

## 2021-11-11 LAB — CBC WITH DIFFERENTIAL (CANCER CENTER ONLY)
Abs Immature Granulocytes: 0.02 10*3/uL (ref 0.00–0.07)
Basophils Absolute: 0 10*3/uL (ref 0.0–0.1)
Basophils Relative: 0 %
Eosinophils Absolute: 0.2 10*3/uL (ref 0.0–0.5)
Eosinophils Relative: 3 %
HCT: 40.5 % (ref 39.0–52.0)
Hemoglobin: 13.4 g/dL (ref 13.0–17.0)
Immature Granulocytes: 0 %
Lymphocytes Relative: 23 %
Lymphs Abs: 1.4 10*3/uL (ref 0.7–4.0)
MCH: 33.7 pg (ref 26.0–34.0)
MCHC: 33.1 g/dL (ref 30.0–36.0)
MCV: 101.8 fL — ABNORMAL HIGH (ref 80.0–100.0)
Monocytes Absolute: 0.6 10*3/uL (ref 0.1–1.0)
Monocytes Relative: 10 %
Neutro Abs: 3.7 10*3/uL (ref 1.7–7.7)
Neutrophils Relative %: 64 %
Platelet Count: 151 10*3/uL (ref 150–400)
RBC: 3.98 MIL/uL — ABNORMAL LOW (ref 4.22–5.81)
RDW: 17.9 % — ABNORMAL HIGH (ref 11.5–15.5)
WBC Count: 5.9 10*3/uL (ref 4.0–10.5)
nRBC: 0 % (ref 0.0–0.2)

## 2021-11-11 NOTE — Progress Notes (Signed)
?  Stephen Carey ?OFFICE PROGRESS NOTE ? ? ?Diagnosis: Appendix cancer ? ?INTERVAL HISTORY:  ? ?Stephen Carey returns as scheduled.  Complete another cycle of Xeloda beginning 10/21/2021.  He noted soreness at the anterior upper gumline.  No discrete ulcers.  No diarrhea or hand/foot pain.  He is working. ? ?Objective: ? ?Vital signs in last 24 hours: ? ?Blood pressure 124/76, pulse 68, temperature 98.2 ?F (36.8 ?C), temperature source Oral, resp. rate 18, height 6' (1.829 m), weight 223 lb (101.2 kg), SpO2 99 %. ?  ? ?HEENT: Erythema at the right greater than left upper gingiva, no discrete ulcer.  No thrush. ?Resp: Lungs clear bilaterally ?Cardio: Regular rate and rhythm ?GI: No hepatosplenomegaly, nontender ?Vascular: No leg edema  ?Skin: Dryness of the hands.  Dryness and callus formation at the soles. ? ?Lab Results: ? ?Lab Results  ?Component Value Date  ? WBC 5.9 11/11/2021  ? HGB 13.4 11/11/2021  ? HCT 40.5 11/11/2021  ? MCV 101.8 (H) 11/11/2021  ? PLT 151 11/11/2021  ? NEUTROABS 3.7 11/11/2021  ? ? ?CMP  ?Lab Results  ?Component Value Date  ? NA 139 11/11/2021  ? K 4.0 11/11/2021  ? CL 104 11/11/2021  ? CO2 28 11/11/2021  ? GLUCOSE 100 (H) 11/11/2021  ? BUN 17 11/11/2021  ? CREATININE 1.04 11/11/2021  ? CALCIUM 8.9 11/11/2021  ? PROT 7.2 11/11/2021  ? ALBUMIN 4.3 11/11/2021  ? AST 21 11/11/2021  ? ALT 18 11/11/2021  ? ALKPHOS 68 11/11/2021  ? BILITOT 0.8 11/11/2021  ? GFRNONAA >60 11/11/2021  ? ? ?Lab Results  ?Component Value Date  ? CEA1 3.0 08/14/2021  ? ?Medications: I have reviewed the patient's current medications. ? ? ?Assessment/Plan: ?Goblet cell adenocarcinoma the appendix, stage IIb(pT4a,pN0) ?Appendectomy 06/26/2021, 2 x 1.2 x 1 cm grade 3 goblet cell adenocarcinoma, acellular mucin invades the visceral peritoneum (pT4a), perineural invasion present, proximal margin involved by invasive carcinoma ?CT abdomen/pelvis 06/03/2021-newly dilated and mildly thick-walled appendix without  significant periappendiceal inflammatory changes, mild sigmoid diverticulosis, evolving postinfectious/inflammatory change in the medial basilar right lower lobe ?CT chest 07/15/2021-no evidence of metastatic disease, small nodules at the right lung base-1 appears inflammatory, one 4 mm perifissural nodule ?Right colectomy 08/14/2021-no residual malignancy, 24 negative lymph nodes ?Cycle 1 adjuvant Xeloda 09/09/2021 ?Cycle 2 adjuvant Xeloda 09/30/2021 ?Cycle 3 adjuvant Xeloda 10/21/2021 ?Cycle 4 adjuvant Xeloda 11/11/2021 ?History of peptic ulcer disease ?BPH ?Bilateral inguinal hernia repair ?Left leg superficial phlebitis November 2022 ?COVID-15 April 2021 ? ? ? ?Disposition: ?Stephen Carey has completed 3 cycles of Xeloda.  He continues to tolerate the Xeloda well.  He will begin cycle 4 today.  He will return for an office and lab visit in 3 weeks. ? ?Betsy Coder, MD ? ?11/11/2021  ?9:29 AM ? ? ?

## 2021-11-19 ENCOUNTER — Other Ambulatory Visit (HOSPITAL_COMMUNITY): Payer: Self-pay

## 2021-11-19 ENCOUNTER — Other Ambulatory Visit: Payer: Self-pay | Admitting: Oncology

## 2021-11-19 DIAGNOSIS — C181 Malignant neoplasm of appendix: Secondary | ICD-10-CM

## 2021-11-19 MED ORDER — CAPECITABINE 500 MG PO TABS
2000.0000 mg | ORAL_TABLET | Freq: Two times a day (BID) | ORAL | 0 refills | Status: DC
  Filled 2021-11-27: qty 112, 21d supply, fill #0

## 2021-11-21 ENCOUNTER — Other Ambulatory Visit (HOSPITAL_COMMUNITY): Payer: Self-pay

## 2021-11-22 DIAGNOSIS — M545 Low back pain, unspecified: Secondary | ICD-10-CM | POA: Diagnosis not present

## 2021-11-25 DIAGNOSIS — D239 Other benign neoplasm of skin, unspecified: Secondary | ICD-10-CM | POA: Diagnosis not present

## 2021-11-25 DIAGNOSIS — L570 Actinic keratosis: Secondary | ICD-10-CM | POA: Diagnosis not present

## 2021-11-27 ENCOUNTER — Other Ambulatory Visit (HOSPITAL_COMMUNITY): Payer: Self-pay

## 2021-11-29 DIAGNOSIS — M545 Low back pain, unspecified: Secondary | ICD-10-CM | POA: Diagnosis not present

## 2021-12-02 ENCOUNTER — Inpatient Hospital Stay: Payer: Medicare HMO | Admitting: Nurse Practitioner

## 2021-12-02 ENCOUNTER — Inpatient Hospital Stay: Payer: Medicare HMO | Attending: Oncology

## 2021-12-02 ENCOUNTER — Encounter: Payer: Self-pay | Admitting: Nurse Practitioner

## 2021-12-02 VITALS — BP 126/68 | HR 66 | Temp 98.2°F | Resp 18 | Ht 72.0 in | Wt 225.6 lb

## 2021-12-02 DIAGNOSIS — R0602 Shortness of breath: Secondary | ICD-10-CM | POA: Diagnosis not present

## 2021-12-02 DIAGNOSIS — Z8616 Personal history of COVID-19: Secondary | ICD-10-CM | POA: Insufficient documentation

## 2021-12-02 DIAGNOSIS — R5383 Other fatigue: Secondary | ICD-10-CM | POA: Insufficient documentation

## 2021-12-02 DIAGNOSIS — Z79899 Other long term (current) drug therapy: Secondary | ICD-10-CM | POA: Diagnosis not present

## 2021-12-02 DIAGNOSIS — N400 Enlarged prostate without lower urinary tract symptoms: Secondary | ICD-10-CM | POA: Insufficient documentation

## 2021-12-02 DIAGNOSIS — Z8711 Personal history of peptic ulcer disease: Secondary | ICD-10-CM | POA: Diagnosis not present

## 2021-12-02 DIAGNOSIS — C181 Malignant neoplasm of appendix: Secondary | ICD-10-CM | POA: Diagnosis not present

## 2021-12-02 LAB — CBC WITH DIFFERENTIAL (CANCER CENTER ONLY)
Abs Immature Granulocytes: 0.02 10*3/uL (ref 0.00–0.07)
Basophils Absolute: 0 10*3/uL (ref 0.0–0.1)
Basophils Relative: 1 %
Eosinophils Absolute: 0.2 10*3/uL (ref 0.0–0.5)
Eosinophils Relative: 3 %
HCT: 42.8 % (ref 39.0–52.0)
Hemoglobin: 14.3 g/dL (ref 13.0–17.0)
Immature Granulocytes: 0 %
Lymphocytes Relative: 27 %
Lymphs Abs: 1.5 10*3/uL (ref 0.7–4.0)
MCH: 35.2 pg — ABNORMAL HIGH (ref 26.0–34.0)
MCHC: 33.4 g/dL (ref 30.0–36.0)
MCV: 105.4 fL — ABNORMAL HIGH (ref 80.0–100.0)
Monocytes Absolute: 0.6 10*3/uL (ref 0.1–1.0)
Monocytes Relative: 11 %
Neutro Abs: 3.3 10*3/uL (ref 1.7–7.7)
Neutrophils Relative %: 58 %
Platelet Count: 163 10*3/uL (ref 150–400)
RBC: 4.06 MIL/uL — ABNORMAL LOW (ref 4.22–5.81)
RDW: 17.6 % — ABNORMAL HIGH (ref 11.5–15.5)
WBC Count: 5.7 10*3/uL (ref 4.0–10.5)
nRBC: 0 % (ref 0.0–0.2)

## 2021-12-02 LAB — CMP (CANCER CENTER ONLY)
ALT: 18 U/L (ref 0–44)
AST: 23 U/L (ref 15–41)
Albumin: 4.2 g/dL (ref 3.5–5.0)
Alkaline Phosphatase: 66 U/L (ref 38–126)
Anion gap: 7 (ref 5–15)
BUN: 18 mg/dL (ref 8–23)
CO2: 30 mmol/L (ref 22–32)
Calcium: 9 mg/dL (ref 8.9–10.3)
Chloride: 102 mmol/L (ref 98–111)
Creatinine: 1.06 mg/dL (ref 0.61–1.24)
GFR, Estimated: >60 mL/min (ref >60)
Glucose, Bld: 103 mg/dL — ABNORMAL HIGH (ref 70–99)
Potassium: 4.5 mmol/L (ref 3.5–5.1)
Sodium: 139 mmol/L (ref 135–145)
Total Bilirubin: 0.9 mg/dL (ref 0.3–1.2)
Total Protein: 7.1 g/dL (ref 6.5–8.1)

## 2021-12-02 NOTE — Progress Notes (Signed)
?  [place] ?OFFICE PROGRESS NOTE ? ? ?Diagnosis: Appendix cancer ? ?INTERVAL HISTORY:  ? ?[name] returns as scheduled.  He completed cycle 4 adjuvant Xeloda beginning 11/11/2021.  He denies nausea/vomiting.  No mouth sores.  No diarrhea.  He continues to note dryness on the hands greater than the feet.  Fatigue is main complaint. ? ?Objective: ? ?Vital signs in last 24 hours: ? ?Blood pressure 126/68, pulse 66, temperature 98.2 ?F (36.8 ?C), temperature source Oral, resp. rate 18, height 6' (1.829 m), weight 225 lb 9.6 oz (102.3 kg), SpO2 99 %. ?  ? ?HEENT: No thrush or ulcers. ?Resp: Lungs clear bilaterally. ?Cardio: Regular rate and rhythm. ?GI: Abdomen soft.  No hepatosplenomegaly.  No mass. ?Vascular: No leg edema. ?Skin: Palms are dry appearing.  Soles dry appearing, callus formation. ? ? ?Lab Results: ? ?Lab Results  ?Component Value Date  ? WBC 5.7 12/02/2021  ? HGB 14.3 12/02/2021  ? HCT 42.8 12/02/2021  ? MCV 105.4 (H) 12/02/2021  ? PLT 163 12/02/2021  ? NEUTROABS 3.3 12/02/2021  ? ? ?Imaging: ? ?No results found. ? ?Medications: I have reviewed the patient's current medications. ? ?Assessment/Plan: ?Goblet cell adenocarcinoma the appendix, stage IIb(pT4a,pN0) ?Appendectomy 06/26/2021, 2 x 1.2 x 1 cm grade 3 goblet cell adenocarcinoma, acellular mucin invades the visceral peritoneum (pT4a), perineural invasion present, proximal margin involved by invasive carcinoma ?CT abdomen/pelvis 06/03/2021-newly dilated and mildly thick-walled appendix without significant periappendiceal inflammatory changes, mild sigmoid diverticulosis, evolving postinfectious/inflammatory change in the medial basilar right lower lobe ?CT chest 07/15/2021-no evidence of metastatic disease, small nodules at the right lung base-1 appears inflammatory, one 4 mm perifissural nodule ?Right colectomy 08/14/2021-no residual malignancy, 24 negative lymph nodes ?Cycle 1 adjuvant Xeloda 09/09/2021 ?Cycle 2 adjuvant Xeloda  09/30/2021 ?Cycle 3 adjuvant Xeloda 10/21/2021 ?Cycle 4 adjuvant Xeloda 11/11/2021 ?Cycle 5 adjuvant Xeloda 12/02/2021 ?History of peptic ulcer disease ?BPH ?Bilateral inguinal hernia repair ?Left leg superficial phlebitis November 2022 ?COVID-15 April 2021 ? ?Disposition: [name] appears stable.  He has completed 4 cycles of adjuvant Xeloda.  Overall he is tolerating Xeloda well.  Plan to proceed with cycle 5 today as scheduled. ? ?CBC and chemistry panel reviewed.  Labs adequate to proceed with Xeloda as above. ? ?He will return for lab and follow-up on 12/19/2021.  He will contact the office in the interim with any problems. ? ? ? ?Ned Card ANP/GNP-BC  ? ?12/02/2021  ?9:48 AM ? ? ? ? ? ? ? ?

## 2021-12-06 DIAGNOSIS — M545 Low back pain, unspecified: Secondary | ICD-10-CM | POA: Diagnosis not present

## 2021-12-09 DIAGNOSIS — Z961 Presence of intraocular lens: Secondary | ICD-10-CM | POA: Diagnosis not present

## 2021-12-09 DIAGNOSIS — H524 Presbyopia: Secondary | ICD-10-CM | POA: Diagnosis not present

## 2021-12-16 ENCOUNTER — Other Ambulatory Visit (HOSPITAL_COMMUNITY): Payer: Self-pay

## 2021-12-16 ENCOUNTER — Other Ambulatory Visit: Payer: Self-pay | Admitting: Oncology

## 2021-12-16 DIAGNOSIS — C181 Malignant neoplasm of appendix: Secondary | ICD-10-CM

## 2021-12-17 ENCOUNTER — Other Ambulatory Visit (HOSPITAL_COMMUNITY): Payer: Self-pay

## 2021-12-17 MED ORDER — CAPECITABINE 500 MG PO TABS
2000.0000 mg | ORAL_TABLET | Freq: Two times a day (BID) | ORAL | 0 refills | Status: DC
  Filled 2021-12-17: qty 112, 21d supply, fill #0

## 2021-12-19 ENCOUNTER — Encounter: Payer: Self-pay | Admitting: Oncology

## 2021-12-19 ENCOUNTER — Inpatient Hospital Stay: Payer: Medicare HMO | Admitting: Oncology

## 2021-12-19 ENCOUNTER — Inpatient Hospital Stay: Payer: Medicare HMO

## 2021-12-19 VITALS — BP 127/71 | HR 72 | Temp 98.2°F | Resp 18 | Ht 72.0 in | Wt 224.0 lb

## 2021-12-19 DIAGNOSIS — R0602 Shortness of breath: Secondary | ICD-10-CM | POA: Diagnosis not present

## 2021-12-19 DIAGNOSIS — C181 Malignant neoplasm of appendix: Secondary | ICD-10-CM

## 2021-12-19 DIAGNOSIS — Z8616 Personal history of COVID-19: Secondary | ICD-10-CM | POA: Diagnosis not present

## 2021-12-19 DIAGNOSIS — R5383 Other fatigue: Secondary | ICD-10-CM | POA: Diagnosis not present

## 2021-12-19 DIAGNOSIS — N400 Enlarged prostate without lower urinary tract symptoms: Secondary | ICD-10-CM | POA: Diagnosis not present

## 2021-12-19 DIAGNOSIS — Z79899 Other long term (current) drug therapy: Secondary | ICD-10-CM | POA: Diagnosis not present

## 2021-12-19 DIAGNOSIS — Z8711 Personal history of peptic ulcer disease: Secondary | ICD-10-CM | POA: Diagnosis not present

## 2021-12-19 LAB — CBC WITH DIFFERENTIAL (CANCER CENTER ONLY)
Abs Immature Granulocytes: 0.03 10*3/uL (ref 0.00–0.07)
Basophils Absolute: 0 10*3/uL (ref 0.0–0.1)
Basophils Relative: 0 %
Eosinophils Absolute: 0.2 10*3/uL (ref 0.0–0.5)
Eosinophils Relative: 3 %
HCT: 41.2 % (ref 39.0–52.0)
Hemoglobin: 13.6 g/dL (ref 13.0–17.0)
Immature Granulocytes: 1 %
Lymphocytes Relative: 24 %
Lymphs Abs: 1.6 10*3/uL (ref 0.7–4.0)
MCH: 35.5 pg — ABNORMAL HIGH (ref 26.0–34.0)
MCHC: 33 g/dL (ref 30.0–36.0)
MCV: 107.6 fL — ABNORMAL HIGH (ref 80.0–100.0)
Monocytes Absolute: 0.6 10*3/uL (ref 0.1–1.0)
Monocytes Relative: 9 %
Neutro Abs: 4.2 10*3/uL (ref 1.7–7.7)
Neutrophils Relative %: 63 %
Platelet Count: 183 10*3/uL (ref 150–400)
RBC: 3.83 MIL/uL — ABNORMAL LOW (ref 4.22–5.81)
RDW: 17.4 % — ABNORMAL HIGH (ref 11.5–15.5)
WBC Count: 6.6 10*3/uL (ref 4.0–10.5)
nRBC: 0 % (ref 0.0–0.2)

## 2021-12-19 LAB — CMP (CANCER CENTER ONLY)
ALT: 15 U/L (ref 0–44)
AST: 19 U/L (ref 15–41)
Albumin: 4.2 g/dL (ref 3.5–5.0)
Alkaline Phosphatase: 58 U/L (ref 38–126)
Anion gap: 6 (ref 5–15)
BUN: 21 mg/dL (ref 8–23)
CO2: 29 mmol/L (ref 22–32)
Calcium: 8.9 mg/dL (ref 8.9–10.3)
Chloride: 102 mmol/L (ref 98–111)
Creatinine: 1.11 mg/dL (ref 0.61–1.24)
GFR, Estimated: >60 mL/min (ref >60)
Glucose, Bld: 98 mg/dL (ref 70–99)
Potassium: 4.7 mmol/L (ref 3.5–5.1)
Sodium: 137 mmol/L (ref 135–145)
Total Bilirubin: 1 mg/dL (ref 0.3–1.2)
Total Protein: 7 g/dL (ref 6.5–8.1)

## 2021-12-19 NOTE — Progress Notes (Deleted)
  [place] OFFICE PROGRESS NOTE   Diagnosis:   INTERVAL HISTORY:   ***  Objective:  Vital signs in last 24 hours:  Blood pressure 127/71, pulse 72, temperature 98.2 F (36.8 C), temperature source Oral, resp. rate 18, height 6' (1.829 m), weight 224 lb (101.6 kg), SpO2 100 %.    HEENT: *** Lymphatics: *** Resp: *** Cardio: *** GI: *** Vascular: *** Neuro:***  Skin:***   Portacath/PICC-without erythema  Lab Results:  Lab Results  Component Value Date   WBC 6.6 12/19/2021   HGB 13.6 12/19/2021   HCT 41.2 12/19/2021   MCV 107.6 (H) 12/19/2021   PLT 183 12/19/2021   NEUTROABS 4.2 12/19/2021    CMP  Lab Results  Component Value Date   NA 137 12/19/2021   K 4.7 12/19/2021   CL 102 12/19/2021   CO2 29 12/19/2021   GLUCOSE 98 12/19/2021   BUN 21 12/19/2021   CREATININE 1.11 12/19/2021   CALCIUM 8.9 12/19/2021   PROT 7.0 12/19/2021   ALBUMIN 4.2 12/19/2021   AST 19 12/19/2021   ALT 15 12/19/2021   ALKPHOS 58 12/19/2021   BILITOT 1.0 12/19/2021   GFRNONAA >60 12/19/2021    Lab Results  Component Value Date   CEA1 3.0 08/14/2021    No results found for: INR, LABPROT  Imaging:  No results found.  Medications: I have reviewed the patient's current medications.   Assessment/Plan: Goblet cell adenocarcinoma the appendix, stage IIb(pT4a,pN0) Appendectomy 06/26/2021, 2 x 1.2 x 1 cm grade 3 goblet cell adenocarcinoma, acellular mucin invades the visceral peritoneum (pT4a), perineural invasion present, proximal margin involved by invasive carcinoma CT abdomen/pelvis 06/03/2021-newly dilated and mildly thick-walled appendix without significant periappendiceal inflammatory changes, mild sigmoid diverticulosis, evolving postinfectious/inflammatory change in the medial basilar right lower lobe CT chest 07/15/2021-no evidence of metastatic disease, small nodules at the right lung base-1 appears inflammatory, one 4 mm perifissural nodule Right  colectomy 08/14/2021-no residual malignancy, 24 negative lymph nodes Cycle 1 adjuvant Xeloda 09/09/2021 Cycle 2 adjuvant Xeloda 09/30/2021 Cycle 3 adjuvant Xeloda 10/21/2021 Cycle 4 adjuvant Xeloda 11/11/2021 Cycle 5 adjuvant Xeloda 12/02/2021 History of peptic ulcer disease BPH Bilateral inguinal hernia repair Left leg superficial phlebitis November 2022 COVID-15 April 2021    Disposition: ***  Betsy Coder, MD  12/19/2021  1:00 PM

## 2021-12-19 NOTE — Progress Notes (Signed)
  [place] OFFICE PROGRESS NOTE   Diagnosis: Appendix carcinoma  INTERVAL HISTORY:   Stephen Carey returns as scheduled.  He completed another cycle of Xeloda beginning 12/02/2021.  No mouth sores, nausea, or diarrhea.  No hand or foot pain.  He feels well.  He is playing golf.  He has malaise.  He reports exertional dyspnea when he has malaise.  No chest pain.  Objective:  Vital signs in last 24 hours:  Blood pressure 127/71, pulse 72, temperature 98.2 F (36.8 C), temperature source Oral, resp. rate 18, height 6' (1.829 m), weight 224 lb (101.6 kg), SpO2 100 %.    HEENT: No thrush or ulcers Resp: Lungs clear bilaterally Cardio: Regular rate and rhythm GI: Nontender, no hepatosplenomegaly Vascular: No leg edema Skin: Dryness of the hands, mild hyperpigmentation and callus formation at the soles.   Lab Results:  Lab Results  Component Value Date   WBC 6.6 12/19/2021   HGB 13.6 12/19/2021   HCT 41.2 12/19/2021   MCV 107.6 (H) 12/19/2021   PLT 183 12/19/2021   NEUTROABS 4.2 12/19/2021    CMP  Lab Results  Component Value Date   NA 137 12/19/2021   K 4.7 12/19/2021   CL 102 12/19/2021   CO2 29 12/19/2021   GLUCOSE 98 12/19/2021   BUN 21 12/19/2021   CREATININE 1.11 12/19/2021   CALCIUM 8.9 12/19/2021   PROT 7.0 12/19/2021   ALBUMIN 4.2 12/19/2021   AST 19 12/19/2021   ALT 15 12/19/2021   ALKPHOS 58 12/19/2021   BILITOT 1.0 12/19/2021   GFRNONAA >60 12/19/2021    Lab Results  Component Value Date   CEA1 3.0 08/14/2021    No results found for: INR, LABPROT  Imaging:  No results found.  Medications: I have reviewed the patient's current medications.   Assessment/Plan: Goblet cell adenocarcinoma the appendix, stage IIb(pT4a,pN0) Appendectomy 06/26/2021, 2 x 1.2 x 1 cm grade 3 goblet cell adenocarcinoma, acellular mucin invades the visceral peritoneum (pT4a), perineural invasion present, proximal margin involved by invasive carcinoma CT  abdomen/pelvis 06/03/2021-newly dilated and mildly thick-walled appendix without significant periappendiceal inflammatory changes, mild sigmoid diverticulosis, evolving postinfectious/inflammatory change in the medial basilar right lower lobe CT chest 07/15/2021-no evidence of metastatic disease, small nodules at the right lung base-1 appears inflammatory, one 4 mm perifissural nodule Right colectomy 08/14/2021-no residual malignancy, 24 negative lymph nodes Cycle 1 adjuvant Xeloda 09/09/2021 Cycle 2 adjuvant Xeloda 09/30/2021 Cycle 3 adjuvant Xeloda 10/21/2021 Cycle 4 adjuvant Xeloda 11/11/2021 Cycle 5 adjuvant Xeloda 12/02/2021 Cycle 6 adjuvant Xeloda 12/23/2021 History of peptic ulcer disease BPH Bilateral inguinal hernia repair Left leg superficial phlebitis November 2022 COVID-15 April 2021    Disposition: Stephen Carey appears stable.  He is tolerating the Xeloda well.  He has mild changes of hand/foot syndrome.  He will complete another cycle of Xeloda beginning 12/23/2021.  He will return for an office and lab visit in 3 weeks.  Betsy Coder, MD  12/19/2021  1:29 PM

## 2021-12-25 DIAGNOSIS — M545 Low back pain, unspecified: Secondary | ICD-10-CM | POA: Diagnosis not present

## 2021-12-26 ENCOUNTER — Other Ambulatory Visit (HOSPITAL_COMMUNITY): Payer: Self-pay

## 2022-01-03 ENCOUNTER — Other Ambulatory Visit: Payer: Self-pay | Admitting: *Deleted

## 2022-01-03 ENCOUNTER — Other Ambulatory Visit (HOSPITAL_COMMUNITY): Payer: Self-pay

## 2022-01-03 ENCOUNTER — Other Ambulatory Visit: Payer: Self-pay | Admitting: Oncology

## 2022-01-03 DIAGNOSIS — C181 Malignant neoplasm of appendix: Secondary | ICD-10-CM

## 2022-01-03 MED ORDER — CAPECITABINE 500 MG PO TABS
2000.0000 mg | ORAL_TABLET | Freq: Two times a day (BID) | ORAL | 0 refills | Status: DC
  Filled 2022-01-03: qty 112, 14d supply, fill #0

## 2022-01-03 MED ORDER — CAPECITABINE 500 MG PO TABS
2000.0000 mg | ORAL_TABLET | Freq: Two times a day (BID) | ORAL | 0 refills | Status: DC
  Filled 2022-01-03: qty 112, 21d supply, fill #0

## 2022-01-08 ENCOUNTER — Other Ambulatory Visit (HOSPITAL_COMMUNITY): Payer: Self-pay

## 2022-01-10 ENCOUNTER — Inpatient Hospital Stay: Payer: Medicare HMO | Admitting: Oncology

## 2022-01-10 ENCOUNTER — Inpatient Hospital Stay: Payer: Medicare HMO | Attending: Oncology

## 2022-01-10 ENCOUNTER — Other Ambulatory Visit: Payer: Self-pay

## 2022-01-10 VITALS — BP 125/78 | HR 64 | Temp 98.1°F | Resp 18 | Ht 72.0 in | Wt 225.0 lb

## 2022-01-10 DIAGNOSIS — C181 Malignant neoplasm of appendix: Secondary | ICD-10-CM | POA: Insufficient documentation

## 2022-01-10 DIAGNOSIS — Z8616 Personal history of COVID-19: Secondary | ICD-10-CM | POA: Insufficient documentation

## 2022-01-10 LAB — CBC WITH DIFFERENTIAL (CANCER CENTER ONLY)
Abs Immature Granulocytes: 0.04 10*3/uL (ref 0.00–0.07)
Basophils Absolute: 0 10*3/uL (ref 0.0–0.1)
Basophils Relative: 1 %
Eosinophils Absolute: 0.2 10*3/uL (ref 0.0–0.5)
Eosinophils Relative: 3 %
HCT: 40.1 % (ref 39.0–52.0)
Hemoglobin: 13.5 g/dL (ref 13.0–17.0)
Immature Granulocytes: 1 %
Lymphocytes Relative: 23 %
Lymphs Abs: 1.4 10*3/uL (ref 0.7–4.0)
MCH: 36.8 pg — ABNORMAL HIGH (ref 26.0–34.0)
MCHC: 33.7 g/dL (ref 30.0–36.0)
MCV: 109.3 fL — ABNORMAL HIGH (ref 80.0–100.0)
Monocytes Absolute: 0.5 10*3/uL (ref 0.1–1.0)
Monocytes Relative: 9 %
Neutro Abs: 3.8 10*3/uL (ref 1.7–7.7)
Neutrophils Relative %: 63 %
Platelet Count: 152 10*3/uL (ref 150–400)
RBC: 3.67 MIL/uL — ABNORMAL LOW (ref 4.22–5.81)
RDW: 16.2 % — ABNORMAL HIGH (ref 11.5–15.5)
WBC Count: 5.9 10*3/uL (ref 4.0–10.5)
nRBC: 0 % (ref 0.0–0.2)

## 2022-01-10 LAB — CMP (CANCER CENTER ONLY)
ALT: 17 U/L (ref 0–44)
AST: 19 U/L (ref 15–41)
Albumin: 4.1 g/dL (ref 3.5–5.0)
Alkaline Phosphatase: 57 U/L (ref 38–126)
Anion gap: 6 (ref 5–15)
BUN: 22 mg/dL (ref 8–23)
CO2: 28 mmol/L (ref 22–32)
Calcium: 9.2 mg/dL (ref 8.9–10.3)
Chloride: 105 mmol/L (ref 98–111)
Creatinine: 1.06 mg/dL (ref 0.61–1.24)
GFR, Estimated: >60 mL/min (ref >60)
Glucose, Bld: 101 mg/dL — ABNORMAL HIGH (ref 70–99)
Potassium: 4.4 mmol/L (ref 3.5–5.1)
Sodium: 139 mmol/L (ref 135–145)
Total Bilirubin: 0.8 mg/dL (ref 0.3–1.2)
Total Protein: 6.8 g/dL (ref 6.5–8.1)

## 2022-01-10 NOTE — Progress Notes (Signed)
  [place] OFFICE PROGRESS NOTE   Diagnosis: Appendix cancer  INTERVAL HISTORY:   Stephen Carey returns as scheduled.  He completed another cycle of Xeloda beginning 12/23/2021.  No mouth sores, nausea, or diarrhea.  He has hyperpigmentation of the hands and dryness of the soles.  He has malaise.  He is playing golf frequently.  He feels he is developing "allergy "symptoms with sinus congestion today.  Objective:  Vital signs in last 24 hours:  Blood pressure 125/78, pulse 64, temperature 98.1 F (36.7 C), temperature source Oral, resp. rate 18, height 6' (1.829 m), weight 225 lb (102.1 kg), SpO2 100 %.    HEENT: No thrush or ulcers Resp: Lungs clear bilaterally Cardio: Regular rate and rhythm GI: Nontender, no hepatosplenomegaly Vascular: No leg edema  Skin: Hyperpigmentation at the dorsum of the hands, dryness, callus formation, and hyperpigmentation of the soles   Lab Results:  Lab Results  Component Value Date   WBC 5.9 01/10/2022   HGB 13.5 01/10/2022   HCT 40.1 01/10/2022   MCV 109.3 (H) 01/10/2022   PLT 152 01/10/2022   NEUTROABS 3.8 01/10/2022    CMP  Lab Results  Component Value Date   NA 137 12/19/2021   K 4.7 12/19/2021   CL 102 12/19/2021   CO2 29 12/19/2021   GLUCOSE 98 12/19/2021   BUN 21 12/19/2021   CREATININE 1.11 12/19/2021   CALCIUM 8.9 12/19/2021   PROT 7.0 12/19/2021   ALBUMIN 4.2 12/19/2021   AST 19 12/19/2021   ALT 15 12/19/2021   ALKPHOS 58 12/19/2021   BILITOT 1.0 12/19/2021   GFRNONAA >60 12/19/2021    Lab Results  Component Value Date   CEA1 3.0 08/14/2021    Medications: I have reviewed the patient's current medications.   Assessment/Plan: Goblet cell adenocarcinoma the appendix, stage IIb(pT4a,pN0) Appendectomy 06/26/2021, 2 x 1.2 x 1 cm grade 3 goblet cell adenocarcinoma, acellular mucin invades the visceral peritoneum (pT4a), perineural invasion present, proximal margin involved by invasive carcinoma CT  abdomen/pelvis 06/03/2021-newly dilated and mildly thick-walled appendix without significant periappendiceal inflammatory changes, mild sigmoid diverticulosis, evolving postinfectious/inflammatory change in the medial basilar right lower lobe CT chest 07/15/2021-no evidence of metastatic disease, small nodules at the right lung base-1 appears inflammatory, one 4 mm perifissural nodule Right colectomy 08/14/2021-no residual malignancy, 24 negative lymph nodes Cycle 1 adjuvant Xeloda 09/09/2021 Cycle 2 adjuvant Xeloda 09/30/2021 Cycle 3 adjuvant Xeloda 10/21/2021 Cycle 4 adjuvant Xeloda 11/11/2021 Cycle 5 adjuvant Xeloda 12/02/2021 Cycle 6 adjuvant Xeloda 12/23/2021 Cycle 7 adjuvant Xeloda 01/13/2022 History of peptic ulcer disease BPH Bilateral inguinal hernia repair Left leg superficial phlebitis November 2022 COVID-15 April 2021    Disposition: Stephen Carey has completed 6 cycles of adjuvant Xeloda.  He has tolerated the chemotherapy well.  He will begin cycle 7 on 01/13/2022.  He will return for an office and lab visit in 3 weeks.  I encouraged him to be careful with sun exposure.  Betsy Coder, MD  01/10/2022  9:00 AM

## 2022-01-30 ENCOUNTER — Inpatient Hospital Stay: Payer: Medicare HMO | Attending: Oncology

## 2022-01-30 ENCOUNTER — Inpatient Hospital Stay (HOSPITAL_BASED_OUTPATIENT_CLINIC_OR_DEPARTMENT_OTHER): Payer: Medicare HMO | Admitting: Oncology

## 2022-01-30 ENCOUNTER — Other Ambulatory Visit (HOSPITAL_COMMUNITY): Payer: Self-pay

## 2022-01-30 ENCOUNTER — Other Ambulatory Visit: Payer: Self-pay | Admitting: *Deleted

## 2022-01-30 VITALS — BP 128/76 | HR 76 | Temp 98.1°F | Resp 18 | Ht 72.0 in | Wt 225.4 lb

## 2022-01-30 DIAGNOSIS — N400 Enlarged prostate without lower urinary tract symptoms: Secondary | ICD-10-CM | POA: Insufficient documentation

## 2022-01-30 DIAGNOSIS — C181 Malignant neoplasm of appendix: Secondary | ICD-10-CM | POA: Diagnosis not present

## 2022-01-30 DIAGNOSIS — Z8616 Personal history of COVID-19: Secondary | ICD-10-CM | POA: Insufficient documentation

## 2022-01-30 DIAGNOSIS — Z8711 Personal history of peptic ulcer disease: Secondary | ICD-10-CM | POA: Diagnosis not present

## 2022-01-30 LAB — CBC WITH DIFFERENTIAL (CANCER CENTER ONLY)
Abs Immature Granulocytes: 0.03 10*3/uL (ref 0.00–0.07)
Basophils Absolute: 0 10*3/uL (ref 0.0–0.1)
Basophils Relative: 0 %
Eosinophils Absolute: 0.2 10*3/uL (ref 0.0–0.5)
Eosinophils Relative: 3 %
HCT: 41.9 % (ref 39.0–52.0)
Hemoglobin: 14 g/dL (ref 13.0–17.0)
Immature Granulocytes: 0 %
Lymphocytes Relative: 21 %
Lymphs Abs: 1.4 10*3/uL (ref 0.7–4.0)
MCH: 36.6 pg — ABNORMAL HIGH (ref 26.0–34.0)
MCHC: 33.4 g/dL (ref 30.0–36.0)
MCV: 109.4 fL — ABNORMAL HIGH (ref 80.0–100.0)
Monocytes Absolute: 0.7 10*3/uL (ref 0.1–1.0)
Monocytes Relative: 9 %
Neutro Abs: 4.6 10*3/uL (ref 1.7–7.7)
Neutrophils Relative %: 67 %
Platelet Count: 181 10*3/uL (ref 150–400)
RBC: 3.83 MIL/uL — ABNORMAL LOW (ref 4.22–5.81)
RDW: 15.6 % — ABNORMAL HIGH (ref 11.5–15.5)
WBC Count: 6.9 10*3/uL (ref 4.0–10.5)
nRBC: 0 % (ref 0.0–0.2)

## 2022-01-30 LAB — CMP (CANCER CENTER ONLY)
ALT: 19 U/L (ref 0–44)
AST: 22 U/L (ref 15–41)
Albumin: 4.3 g/dL (ref 3.5–5.0)
Alkaline Phosphatase: 56 U/L (ref 38–126)
Anion gap: 5 (ref 5–15)
BUN: 16 mg/dL (ref 8–23)
CO2: 31 mmol/L (ref 22–32)
Calcium: 9.2 mg/dL (ref 8.9–10.3)
Chloride: 100 mmol/L (ref 98–111)
Creatinine: 1.05 mg/dL (ref 0.61–1.24)
GFR, Estimated: >60 mL/min (ref >60)
Glucose, Bld: 97 mg/dL (ref 70–99)
Potassium: 4.2 mmol/L (ref 3.5–5.1)
Sodium: 136 mmol/L (ref 135–145)
Total Bilirubin: 1 mg/dL (ref 0.3–1.2)
Total Protein: 7.1 g/dL (ref 6.5–8.1)

## 2022-01-30 MED ORDER — CAPECITABINE 500 MG PO TABS
2000.0000 mg | ORAL_TABLET | Freq: Two times a day (BID) | ORAL | 0 refills | Status: DC
  Filled 2022-01-30: qty 112, 21d supply, fill #0

## 2022-01-30 NOTE — Telephone Encounter (Signed)
Patient saw MD today and requesting refill on capecitabine.

## 2022-01-30 NOTE — Progress Notes (Signed)
  [place] OFFICE PROGRESS NOTE   Diagnosis: Appendix carcinoma  INTERVAL HISTORY:   Stephen Carey returns as scheduled.  He completed another cycle of Xeloda beginning 01/13/2022.  No rash, mouth sores, diarrhea, or hand/foot pain.   Objective:  Vital signs in last 24 hours:  Blood pressure 128/76, pulse 76, temperature 98.1 F (36.7 C), temperature source Oral, resp. rate 18, height 6' (1.829 m), weight 225 lb 6.4 oz (102.2 kg), SpO2 100 %.    HEENT: No thrush or ulcers Resp: Lungs clear bilaterally Cardio: Regular rate and rhythm GI: No hepatosplenomegaly Vascular: No leg edema  Skin: Skin thickening and dryness of the palms, skin thickening and hyperpigmentation of the soles with callus formation.  Lab Results:  Lab Results  Component Value Date   WBC 6.9 01/30/2022   HGB 14.0 01/30/2022   HCT 41.9 01/30/2022   MCV 109.4 (H) 01/30/2022   PLT 181 01/30/2022   NEUTROABS 4.6 01/30/2022    CMP  Lab Results  Component Value Date   NA 139 01/10/2022   K 4.4 01/10/2022   CL 105 01/10/2022   CO2 28 01/10/2022   GLUCOSE 101 (H) 01/10/2022   BUN 22 01/10/2022   CREATININE 1.06 01/10/2022   CALCIUM 9.2 01/10/2022   PROT 6.8 01/10/2022   ALBUMIN 4.1 01/10/2022   AST 19 01/10/2022   ALT 17 01/10/2022   ALKPHOS 57 01/10/2022   BILITOT 0.8 01/10/2022   GFRNONAA >60 01/10/2022    Lab Results  Component Value Date   CEA1 3.0 08/14/2021     Medications: I have reviewed the patient's current medications.   Assessment/Plan:  Goblet cell adenocarcinoma the appendix, stage IIb(pT4a,pN0) Appendectomy 06/26/2021, 2 x 1.2 x 1 cm grade 3 goblet cell adenocarcinoma, acellular mucin invades the visceral peritoneum (pT4a), perineural invasion present, proximal margin involved by invasive carcinoma CT abdomen/pelvis 06/03/2021-newly dilated and mildly thick-walled appendix without significant periappendiceal inflammatory changes, mild sigmoid diverticulosis,  evolving postinfectious/inflammatory change in the medial basilar right lower lobe CT chest 07/15/2021-no evidence of metastatic disease, small nodules at the right lung base-1 appears inflammatory, one 4 mm perifissural nodule Right colectomy 08/14/2021-no residual malignancy, 24 negative lymph nodes Cycle 1 adjuvant Xeloda 09/09/2021 Cycle 2 adjuvant Xeloda 09/30/2021 Cycle 3 adjuvant Xeloda 10/21/2021 Cycle 4 adjuvant Xeloda 11/11/2021 Cycle 5 adjuvant Xeloda 12/02/2021 Cycle 6 adjuvant Xeloda 12/23/2021 Cycle 7 adjuvant Xeloda 01/13/2022 Cycle 8 adjuvant Xeloda 02/03/2022 History of peptic ulcer disease BPH Bilateral inguinal hernia repair Left leg superficial phlebitis November 2022 COVID-15 April 2021     Disposition: Stephen Carey appears stable.  He continues to tolerate the Xeloda well.  He will complete a final cycle of adjuvant Xeloda beginning 02/03/2022.  He will return for an office and lab visit in 5-6 weeks.  We will plan for surveillance imaging in November.  Betsy Coder, MD  01/30/2022  11:27 AM

## 2022-02-03 DIAGNOSIS — N5201 Erectile dysfunction due to arterial insufficiency: Secondary | ICD-10-CM | POA: Diagnosis not present

## 2022-02-03 DIAGNOSIS — Z125 Encounter for screening for malignant neoplasm of prostate: Secondary | ICD-10-CM | POA: Diagnosis not present

## 2022-02-06 ENCOUNTER — Other Ambulatory Visit (HOSPITAL_COMMUNITY): Payer: Self-pay

## 2022-02-18 ENCOUNTER — Other Ambulatory Visit (HOSPITAL_COMMUNITY): Payer: Self-pay

## 2022-03-10 ENCOUNTER — Inpatient Hospital Stay: Payer: Medicare HMO | Attending: Oncology

## 2022-03-10 ENCOUNTER — Inpatient Hospital Stay (HOSPITAL_BASED_OUTPATIENT_CLINIC_OR_DEPARTMENT_OTHER): Payer: Medicare HMO | Admitting: Oncology

## 2022-03-10 ENCOUNTER — Other Ambulatory Visit (HOSPITAL_COMMUNITY): Payer: Self-pay

## 2022-03-10 VITALS — BP 120/81 | HR 70 | Temp 98.2°F | Resp 18 | Ht 72.0 in | Wt 224.8 lb

## 2022-03-10 DIAGNOSIS — Z8711 Personal history of peptic ulcer disease: Secondary | ICD-10-CM | POA: Diagnosis not present

## 2022-03-10 DIAGNOSIS — Z8616 Personal history of COVID-19: Secondary | ICD-10-CM | POA: Insufficient documentation

## 2022-03-10 DIAGNOSIS — C181 Malignant neoplasm of appendix: Secondary | ICD-10-CM | POA: Diagnosis not present

## 2022-03-10 DIAGNOSIS — N400 Enlarged prostate without lower urinary tract symptoms: Secondary | ICD-10-CM | POA: Insufficient documentation

## 2022-03-10 LAB — CMP (CANCER CENTER ONLY)
ALT: 18 U/L (ref 0–44)
AST: 20 U/L (ref 15–41)
Albumin: 4.4 g/dL (ref 3.5–5.0)
Alkaline Phosphatase: 74 U/L (ref 38–126)
Anion gap: 6 (ref 5–15)
BUN: 19 mg/dL (ref 8–23)
CO2: 32 mmol/L (ref 22–32)
Calcium: 9.1 mg/dL (ref 8.9–10.3)
Chloride: 103 mmol/L (ref 98–111)
Creatinine: 1.15 mg/dL (ref 0.61–1.24)
GFR, Estimated: >60 mL/min (ref >60)
Glucose, Bld: 108 mg/dL — ABNORMAL HIGH (ref 70–99)
Potassium: 4.3 mmol/L (ref 3.5–5.1)
Sodium: 141 mmol/L (ref 135–145)
Total Bilirubin: 0.8 mg/dL (ref 0.3–1.2)
Total Protein: 7.3 g/dL (ref 6.5–8.1)

## 2022-03-10 LAB — CBC WITH DIFFERENTIAL (CANCER CENTER ONLY)
Abs Immature Granulocytes: 0.02 10*3/uL (ref 0.00–0.07)
Basophils Absolute: 0 10*3/uL (ref 0.0–0.1)
Basophils Relative: 1 %
Eosinophils Absolute: 0.2 10*3/uL (ref 0.0–0.5)
Eosinophils Relative: 3 %
HCT: 45 % (ref 39.0–52.0)
Hemoglobin: 15.2 g/dL (ref 13.0–17.0)
Immature Granulocytes: 0 %
Lymphocytes Relative: 26 %
Lymphs Abs: 1.6 10*3/uL (ref 0.7–4.0)
MCH: 36.2 pg — ABNORMAL HIGH (ref 26.0–34.0)
MCHC: 33.8 g/dL (ref 30.0–36.0)
MCV: 107.1 fL — ABNORMAL HIGH (ref 80.0–100.0)
Monocytes Absolute: 0.6 10*3/uL (ref 0.1–1.0)
Monocytes Relative: 10 %
Neutro Abs: 3.6 10*3/uL (ref 1.7–7.7)
Neutrophils Relative %: 60 %
Platelet Count: 159 10*3/uL (ref 150–400)
RBC: 4.2 MIL/uL — ABNORMAL LOW (ref 4.22–5.81)
RDW: 13.9 % (ref 11.5–15.5)
WBC Count: 6.1 10*3/uL (ref 4.0–10.5)
nRBC: 0 % (ref 0.0–0.2)

## 2022-03-10 LAB — CEA (ACCESS): CEA (CHCC): 1.31 ng/mL (ref 0.00–5.00)

## 2022-03-10 NOTE — Progress Notes (Signed)
  [place] OFFICE PROGRESS NOTE   Diagnosis: Appendix carcinoma  INTERVAL HISTORY:   Mr. Fielden returns as scheduled.  He completed a final cycle of Xeloda on 02/03/2022.  No mouth sores, nausea, diarrhea, or hand/foot pain.  He feels well.  He is working.  He plays golf frequently.  He has intermittent discomfort in the right lower abdomen.  Objective:  Vital signs in last 24 hours:  Blood pressure 120/81, pulse 70, temperature 98.2 F (36.8 C), temperature source Oral, resp. rate 18, height 6' (1.829 m), weight 224 lb 12.8 oz (102 kg), SpO2 100 %.    HEENT: No thrush or ulcers Lymphatics: No cervical, supraclavicular, axillary, or inguinal nodes Resp: Lungs clear bilaterally Cardio: Regular rate and rhythm GI: No hepatosplenomegaly, no mass, nontender Vascular: No leg edema Skin: Palms without erythema   Lab Results:  Lab Results  Component Value Date   WBC 6.1 03/10/2022   HGB 15.2 03/10/2022   HCT 45.0 03/10/2022   MCV 107.1 (H) 03/10/2022   PLT 159 03/10/2022   NEUTROABS 3.6 03/10/2022    CMP  Lab Results  Component Value Date   NA 136 01/30/2022   K 4.2 01/30/2022   CL 100 01/30/2022   CO2 31 01/30/2022   GLUCOSE 97 01/30/2022   BUN 16 01/30/2022   CREATININE 1.05 01/30/2022   CALCIUM 9.2 01/30/2022   PROT 7.1 01/30/2022   ALBUMIN 4.3 01/30/2022   AST 22 01/30/2022   ALT 19 01/30/2022   ALKPHOS 56 01/30/2022   BILITOT 1.0 01/30/2022   GFRNONAA >60 01/30/2022    Lab Results  Component Value Date   CEA1 3.0 08/14/2021    Medications: I have reviewed the patient's current medications.   Assessment/Plan: Goblet cell adenocarcinoma the appendix, stage IIb(pT4a,pN0) Appendectomy 06/26/2021, 2 x 1.2 x 1 cm grade 3 goblet cell adenocarcinoma, acellular mucin invades the visceral peritoneum (pT4a), perineural invasion present, proximal margin involved by invasive carcinoma CT abdomen/pelvis 06/03/2021-newly dilated and mildly  thick-walled appendix without significant periappendiceal inflammatory changes, mild sigmoid diverticulosis, evolving postinfectious/inflammatory change in the medial basilar right lower lobe CT chest 07/15/2021-no evidence of metastatic disease, small nodules at the right lung base-1 appears inflammatory, one 4 mm perifissural nodule Right colectomy 08/14/2021-no residual malignancy, 24 negative lymph nodes Cycle 1 adjuvant Xeloda 09/09/2021 Cycle 2 adjuvant Xeloda 09/30/2021 Cycle 3 adjuvant Xeloda 10/21/2021 Cycle 4 adjuvant Xeloda 11/11/2021 Cycle 5 adjuvant Xeloda 12/02/2021 Cycle 6 adjuvant Xeloda 12/23/2021 Cycle 7 adjuvant Xeloda 01/13/2022 Cycle 8 adjuvant Xeloda 02/03/2022 History of peptic ulcer disease BPH Bilateral inguinal hernia repair Left leg superficial phlebitis November 2022 COVID-15 April 2021     Disposition: Stephen Carey has completed the course of adjuvant Xeloda for treatment of appendix carcinoma.  We will follow-up on the CEA from today.  He will return for an office visit and surveillance CTs in 3 months.  Betsy Coder, MD  03/10/2022  8:55 AM

## 2022-03-27 ENCOUNTER — Other Ambulatory Visit (HOSPITAL_COMMUNITY): Payer: Self-pay

## 2022-04-03 DIAGNOSIS — M7701 Medial epicondylitis, right elbow: Secondary | ICD-10-CM | POA: Diagnosis not present

## 2022-04-22 ENCOUNTER — Other Ambulatory Visit: Payer: Self-pay

## 2022-05-01 ENCOUNTER — Telehealth: Payer: Self-pay

## 2022-05-01 NOTE — Patient Outreach (Signed)
  Care Coordination   Initial Visit Note   05/01/2022 Name: DEMETRIOS BYRON MRN: [id number] DOB: Aug 14, 1955  SHAMIR TUZZOLINO is a 66 y.o. year old male who sees Lavone Orn, MD for primary care. I spoke with  Kathee Delton by phone today.  What matters to the patients health and wellness today?  No Concerns Expressed/Requested Call Back    Goals Addressed   None     SDOH assessments and interventions completed:  No     Care Coordination Interventions Activated:  No  Care Coordination Interventions:  No, not indicated   Follow up plan:  Will follow up next week.    Encounter Outcome:  Pt. Request to Call Back   [facility] [phone]

## 2022-05-08 DIAGNOSIS — M67442 Ganglion, left hand: Secondary | ICD-10-CM | POA: Diagnosis not present

## 2022-05-08 DIAGNOSIS — M7702 Medial epicondylitis, left elbow: Secondary | ICD-10-CM | POA: Diagnosis not present

## 2022-06-02 DIAGNOSIS — L918 Other hypertrophic disorders of the skin: Secondary | ICD-10-CM | POA: Diagnosis not present

## 2022-06-02 DIAGNOSIS — Z85828 Personal history of other malignant neoplasm of skin: Secondary | ICD-10-CM | POA: Diagnosis not present

## 2022-06-02 DIAGNOSIS — D239 Other benign neoplasm of skin, unspecified: Secondary | ICD-10-CM | POA: Diagnosis not present

## 2022-06-02 DIAGNOSIS — L821 Other seborrheic keratosis: Secondary | ICD-10-CM | POA: Diagnosis not present

## 2022-06-02 DIAGNOSIS — E663 Overweight: Secondary | ICD-10-CM | POA: Diagnosis not present

## 2022-06-02 DIAGNOSIS — K130 Diseases of lips: Secondary | ICD-10-CM | POA: Diagnosis not present

## 2022-06-12 DIAGNOSIS — R1013 Epigastric pain: Secondary | ICD-10-CM | POA: Diagnosis not present

## 2022-06-12 DIAGNOSIS — K2981 Duodenitis with bleeding: Secondary | ICD-10-CM | POA: Diagnosis not present

## 2022-06-12 DIAGNOSIS — C181 Malignant neoplasm of appendix: Secondary | ICD-10-CM | POA: Diagnosis not present

## 2022-06-12 DIAGNOSIS — K5730 Diverticulosis of large intestine without perforation or abscess without bleeding: Secondary | ICD-10-CM | POA: Diagnosis not present

## 2022-06-12 DIAGNOSIS — K2930 Chronic superficial gastritis without bleeding: Secondary | ICD-10-CM | POA: Diagnosis not present

## 2022-06-12 DIAGNOSIS — Z980 Intestinal bypass and anastomosis status: Secondary | ICD-10-CM | POA: Diagnosis not present

## 2022-06-12 DIAGNOSIS — D124 Benign neoplasm of descending colon: Secondary | ICD-10-CM | POA: Diagnosis not present

## 2022-06-12 DIAGNOSIS — K649 Unspecified hemorrhoids: Secondary | ICD-10-CM | POA: Diagnosis not present

## 2022-06-16 ENCOUNTER — Encounter (HOSPITAL_BASED_OUTPATIENT_CLINIC_OR_DEPARTMENT_OTHER): Payer: Self-pay

## 2022-06-16 ENCOUNTER — Inpatient Hospital Stay: Payer: Medicare HMO | Attending: Oncology

## 2022-06-16 ENCOUNTER — Ambulatory Visit (HOSPITAL_BASED_OUTPATIENT_CLINIC_OR_DEPARTMENT_OTHER)
Admission: RE | Admit: 2022-06-16 | Discharge: 2022-06-16 | Disposition: A | Discharge status: Home / Self Care | Payer: Medicare HMO | Source: Ambulatory Visit | Attending: Oncology | Admitting: Oncology

## 2022-06-16 DIAGNOSIS — N400 Enlarged prostate without lower urinary tract symptoms: Secondary | ICD-10-CM | POA: Insufficient documentation

## 2022-06-16 DIAGNOSIS — C181 Malignant neoplasm of appendix: Secondary | ICD-10-CM | POA: Insufficient documentation

## 2022-06-16 DIAGNOSIS — R911 Solitary pulmonary nodule: Secondary | ICD-10-CM | POA: Diagnosis not present

## 2022-06-16 DIAGNOSIS — Z8616 Personal history of COVID-19: Secondary | ICD-10-CM | POA: Insufficient documentation

## 2022-06-16 DIAGNOSIS — K4090 Unilateral inguinal hernia, without obstruction or gangrene, not specified as recurrent: Secondary | ICD-10-CM | POA: Diagnosis not present

## 2022-06-16 DIAGNOSIS — Z8711 Personal history of peptic ulcer disease: Secondary | ICD-10-CM | POA: Insufficient documentation

## 2022-06-16 DIAGNOSIS — Z8589 Personal history of malignant neoplasm of other organs and systems: Secondary | ICD-10-CM | POA: Insufficient documentation

## 2022-06-16 DIAGNOSIS — I700 Atherosclerosis of aorta: Secondary | ICD-10-CM | POA: Diagnosis not present

## 2022-06-16 DIAGNOSIS — Z86718 Personal history of other venous thrombosis and embolism: Secondary | ICD-10-CM | POA: Insufficient documentation

## 2022-06-16 DIAGNOSIS — Z9221 Personal history of antineoplastic chemotherapy: Secondary | ICD-10-CM | POA: Insufficient documentation

## 2022-06-16 LAB — BASIC METABOLIC PANEL - CANCER CENTER ONLY
Anion gap: 6 (ref 5–15)
BUN: 17 mg/dL (ref 8–23)
CO2: 31 mmol/L (ref 22–32)
Calcium: 9.5 mg/dL (ref 8.9–10.3)
Chloride: 102 mmol/L (ref 98–111)
Creatinine: 1.09 mg/dL (ref 0.61–1.24)
GFR, Estimated: >60 mL/min (ref >60)
Glucose, Bld: 101 mg/dL — ABNORMAL HIGH (ref 70–99)
Potassium: 4.8 mmol/L (ref 3.5–5.1)
Sodium: 139 mmol/L (ref 135–145)

## 2022-06-16 LAB — CEA (ACCESS): CEA (CHCC): 1.5 ng/mL (ref 0.00–5.00)

## 2022-06-16 MED ORDER — IOHEXOL 300 MG/ML  SOLN
100.0000 mL | Freq: Once | INTRAMUSCULAR | Status: AC | PRN
  Administered 2022-06-16: 100 mL via INTRAVENOUS

## 2022-06-17 DIAGNOSIS — K2930 Chronic superficial gastritis without bleeding: Secondary | ICD-10-CM | POA: Diagnosis not present

## 2022-06-17 DIAGNOSIS — K2981 Duodenitis with bleeding: Secondary | ICD-10-CM | POA: Diagnosis not present

## 2022-06-17 DIAGNOSIS — D124 Benign neoplasm of descending colon: Secondary | ICD-10-CM | POA: Diagnosis not present

## 2022-06-18 ENCOUNTER — Inpatient Hospital Stay: Payer: Medicare HMO | Admitting: Oncology

## 2022-06-18 VITALS — BP 134/85 | HR 71 | Temp 98.2°F | Resp 18 | Ht 72.0 in | Wt 228.6 lb

## 2022-06-18 DIAGNOSIS — Z8711 Personal history of peptic ulcer disease: Secondary | ICD-10-CM | POA: Diagnosis not present

## 2022-06-18 DIAGNOSIS — C181 Malignant neoplasm of appendix: Secondary | ICD-10-CM

## 2022-06-18 DIAGNOSIS — Z9221 Personal history of antineoplastic chemotherapy: Secondary | ICD-10-CM | POA: Diagnosis not present

## 2022-06-18 DIAGNOSIS — Z86718 Personal history of other venous thrombosis and embolism: Secondary | ICD-10-CM | POA: Diagnosis not present

## 2022-06-18 DIAGNOSIS — N400 Enlarged prostate without lower urinary tract symptoms: Secondary | ICD-10-CM | POA: Diagnosis not present

## 2022-06-18 DIAGNOSIS — Z8616 Personal history of COVID-19: Secondary | ICD-10-CM | POA: Diagnosis not present

## 2022-06-18 DIAGNOSIS — Z8589 Personal history of malignant neoplasm of other organs and systems: Secondary | ICD-10-CM | POA: Diagnosis not present

## 2022-06-18 NOTE — Progress Notes (Signed)
[place] OFFICE PROGRESS NOTE   Diagnosis: Colon cancer  INTERVAL HISTORY:   Stephen Carey returns as scheduled.  He feels well.  No difficulty with bowel function.  Good appetite.  No complaint. He reports undergoing a surveillance colonoscopy 06/12/2022. Objective:  Vital signs in last 24 hours:  Blood pressure 134/85, pulse 71, temperature 98.2 F (36.8 C), temperature source Oral, resp. rate 18, height 6' (1.829 m), weight 228 lb 9.6 oz (103.7 kg), SpO2 100 %.   Lymphatics: No cervical, supraclavicular, axillary, or inguinal nodes Resp: Lungs clear bilaterally Cardio: Regular rate and rhythm GI: No hepatosplenomegaly, no mass, nontender Vascular: No leg edema  Lab Results:  Lab Results  Component Value Date   WBC 6.1 03/10/2022   HGB 15.2 03/10/2022   HCT 45.0 03/10/2022   MCV 107.1 (H) 03/10/2022   PLT 159 03/10/2022   NEUTROABS 3.6 03/10/2022    CMP  Lab Results  Component Value Date   NA 139 06/16/2022   K 4.8 06/16/2022   CL 102 06/16/2022   CO2 31 06/16/2022   GLUCOSE 101 (H) 06/16/2022   BUN 17 06/16/2022   CREATININE 1.09 06/16/2022   CALCIUM 9.5 06/16/2022   PROT 7.3 03/10/2022   ALBUMIN 4.4 03/10/2022   AST 20 03/10/2022   ALT 18 03/10/2022   ALKPHOS 74 03/10/2022   BILITOT 0.8 03/10/2022   GFRNONAA >60 06/16/2022    Lab Results  Component Value Date   CEA1 3.0 08/14/2021   CEA 1.50 06/16/2022     Imaging:  CT CHEST ABDOMEN PELVIS W CONTRAST  Result Date: 06/16/2022 CLINICAL DATA:  History of appendiceal cancer. Surveillance. * Tracking Code: BO * EXAM: CT CHEST, ABDOMEN, AND PELVIS WITH CONTRAST TECHNIQUE: Multidetector CT imaging of the chest, abdomen and pelvis was performed following the standard protocol during bolus administration of intravenous contrast. RADIATION DOSE REDUCTION: This exam was performed according to the departmental dose-optimization program which includes automated exposure control, adjustment of  the mA and/or kV according to patient size and/or use of iterative reconstruction technique. CONTRAST:  111m OMNIPAQUE IOHEXOL 300 MG/ML  SOLN COMPARISON:  CT chest July 15, 2021 and abdomen pelvis June 03, 2021 FINDINGS: CT CHEST FINDINGS Cardiovascular: Aortic atherosclerosis. No central pulmonary embolus on this nondedicated study. Normal size heart. No significant pericardial effusion/thickening. Mediastinum/Nodes: No supraclavicular adenopathy. No suspicious thyroid nodule. No pathologically enlarged mediastinal, hilar or axillary lymph nodes. The esophagus is grossly unremarkable. Lungs/Pleura: Stable 4 mm perifissural right middle lobe pulmonary nodule on image 99/4. Interval resolution of the right lower lobe pulmonary nodule seen on prior imaging. Osteophyte related scarring in the paramedian right lower lobe. No pleural effusion. No pneumothorax. Musculoskeletal: Multilevel degenerative changes spine with flowing anterior vertebral osteophytes. CT ABDOMEN PELVIS FINDINGS Hepatobiliary: No suspicious hepatic lesion. Gallbladder is unremarkable. No biliary ductal dilation. Pancreas: No pancreatic ductal dilation or evidence of acute inflammation. Spleen: No splenomegaly. Adrenals/Urinary Tract: Bilateral adrenal glands appear normal. No hydronephrosis. Kidneys demonstrate symmetric enhancement and excretion of contrast material. Urinary bladder is unremarkable for degree of distension. Stomach/Bowel: Radiopaque enteric contrast material traverses distal loops of small bowel. Stomach is unremarkable for degree of distension. No pathologic dilation of small or large bowel. Prior right hemicolectomy with side to side ileocolonic anastomosis in the right upper quadrant. No suspicious nodularity along the anastomotic suture lines. Vascular/Lymphatic: Aortic atherosclerosis. Periportal lymph node measuring 11 mm in short axis on image 68/2 is stable dating back to March 09, 2022. No suspicious  pathologically  enlarged abdominopelvic lymph nodes. Reproductive: Prostate glands unremarkable. Other: Prior bilateral inguinal hernia repairs. No significant abdominopelvic free fluid. No discrete omental or peritoneal nodularity. Musculoskeletal: No aggressive lytic or blastic lesion of bone. IMPRESSION: 1. Prior right hemicolectomy with side to side ileocolonic anastomosis in the right upper quadrant. No evidence of local recurrence. 2. No convincing evidence of metastatic disease within the chest, abdomen or pelvis. 3. Stable 4 mm perifissural right middle lobe pulmonary nodule, favored benign. Attention on follow-up imaging suggested. 4.  Aortic Atherosclerosis (ICD10-I70.0). Electronically Signed   By: Dahlia Bailiff M.D.   On: 06/16/2022 11:16    Medications: I have reviewed the patient's current medications.   Assessment/Plan: Goblet cell adenocarcinoma the appendix, stage IIb(pT4a,pN0) Appendectomy 06/26/2021, 2 x 1.2 x 1 cm grade 3 goblet cell adenocarcinoma, acellular mucin invades the visceral peritoneum (pT4a), perineural invasion present, proximal margin involved by invasive carcinoma CT abdomen/pelvis 06/03/2021-newly dilated and mildly thick-walled appendix without significant periappendiceal inflammatory changes, mild sigmoid diverticulosis, evolving postinfectious/inflammatory change in the medial basilar right lower lobe CT chest 07/15/2021-no evidence of metastatic disease, small nodules at the right lung base-1 appears inflammatory, one 4 mm perifissural nodule Right colectomy 08/14/2021-no residual malignancy, 24 negative lymph nodes Cycle 1 adjuvant Xeloda 09/09/2021 Cycle 2 adjuvant Xeloda 09/30/2021 Cycle 3 adjuvant Xeloda 10/21/2021 Cycle 4 adjuvant Xeloda 11/11/2021 Cycle 5 adjuvant Xeloda 12/02/2021 Cycle 6 adjuvant Xeloda 12/23/2021 Cycle 7 adjuvant Xeloda 01/13/2022 Cycle 8 adjuvant Xeloda 02/03/2022 CTs 06/16/2022-no evidence of recurrent disease, stable 4 mm perifissural right  middle lobe nodule History of peptic ulcer disease BPH Bilateral inguinal hernia repair Left leg superficial phlebitis November 2022 COVID-15 April 2021    Disposition: Stephen Carey is in clinical remission from appendix cancer.  He will return for an office visit and CEA in 6 months.  We will follow-up on the surveillance colonoscopy report.  Betsy Coder, MD  06/18/2022  8:37 AM

## 2022-06-22 IMAGING — CR DG HIP (WITH OR WITHOUT PELVIS) 2-3V*L*
3 series · 3 of 3 positions shown · non-contrast
Comparison: None.

CLINICAL DATA: Left hip pain.

EXAM:
DG HIP (WITH OR WITHOUT PELVIS) 2-3V LEFT

[w pelvis upright]
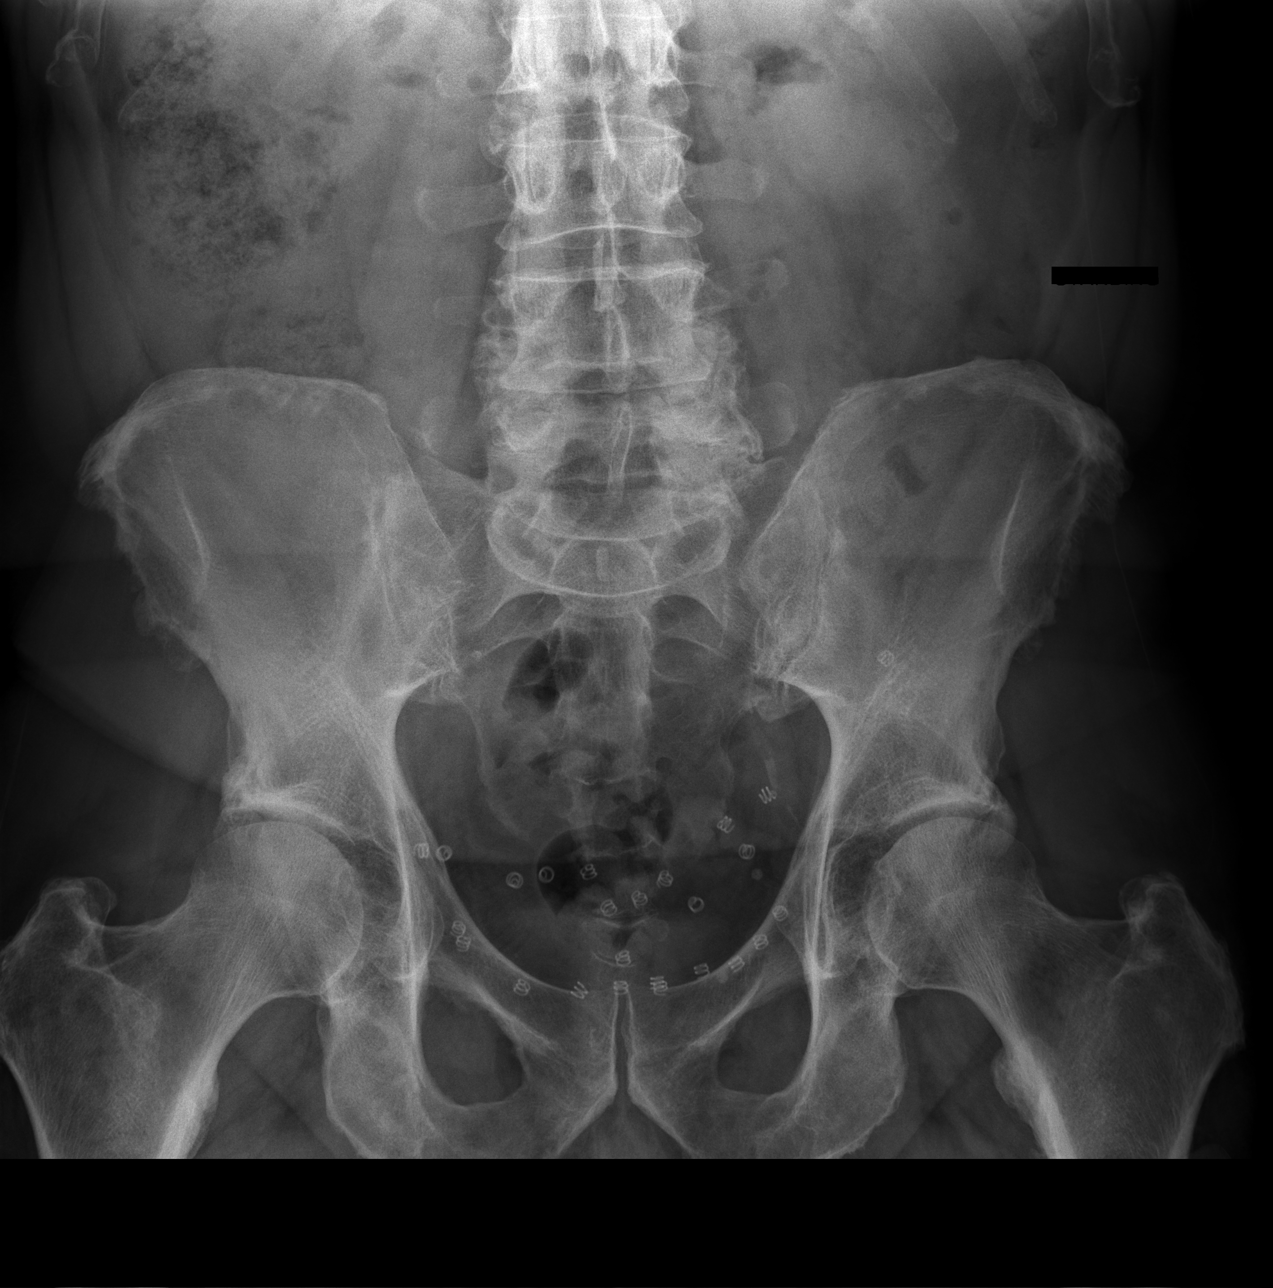

[w hip ap left]
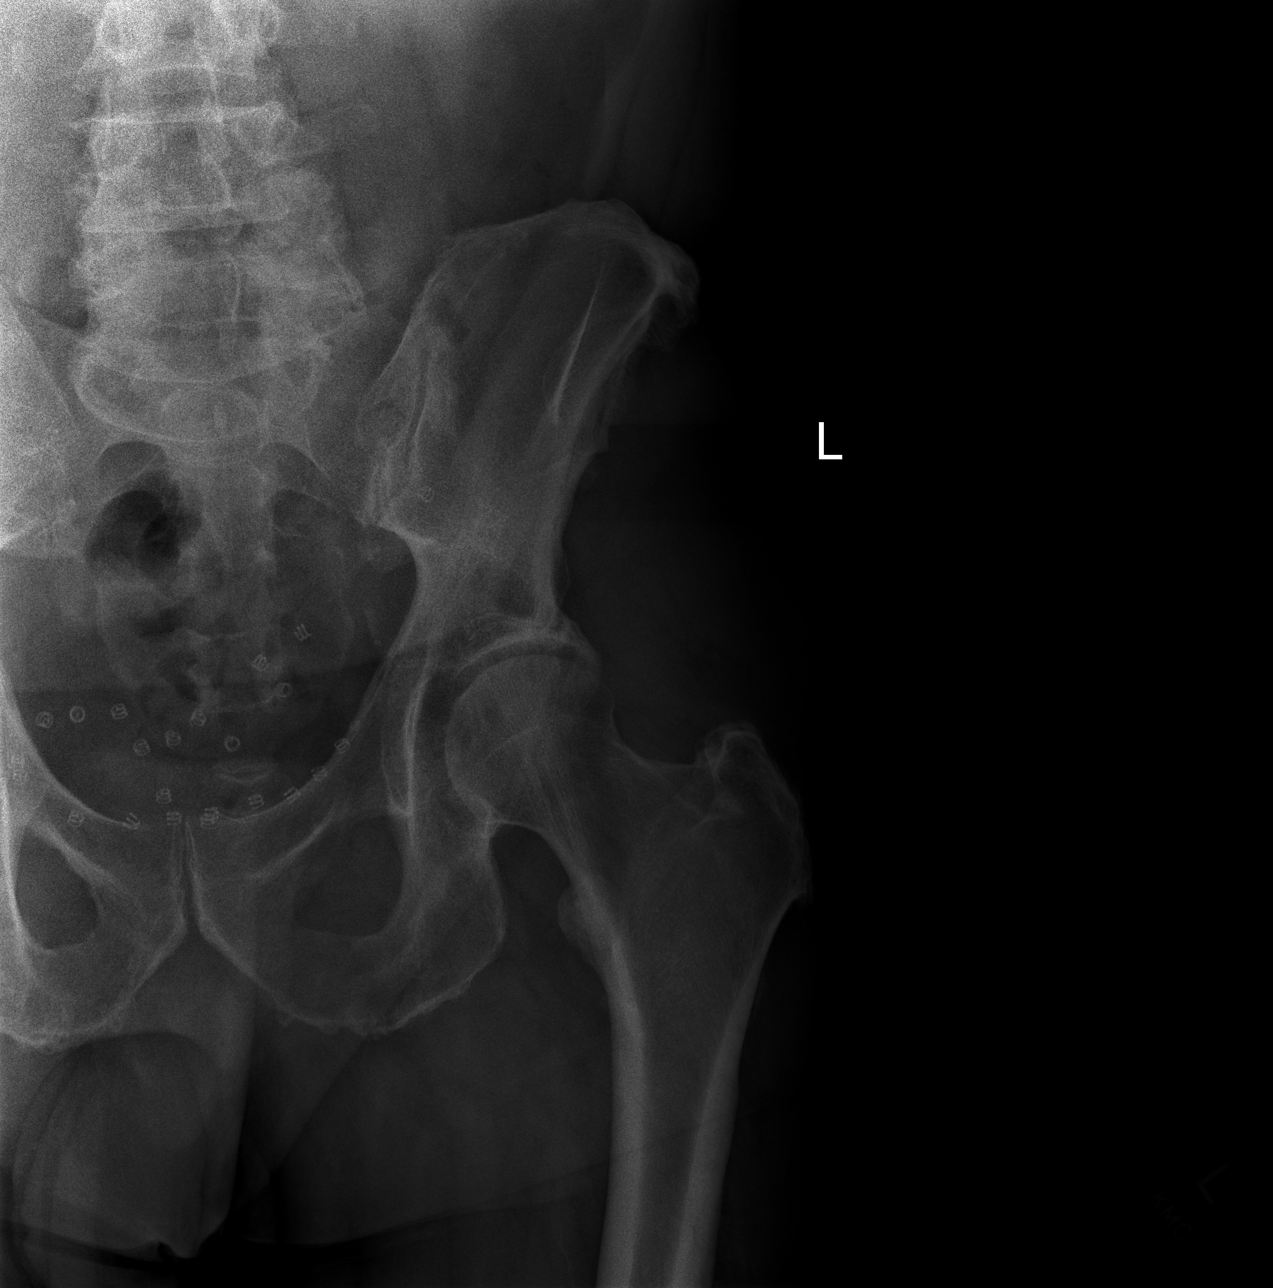

[w hip lat left]
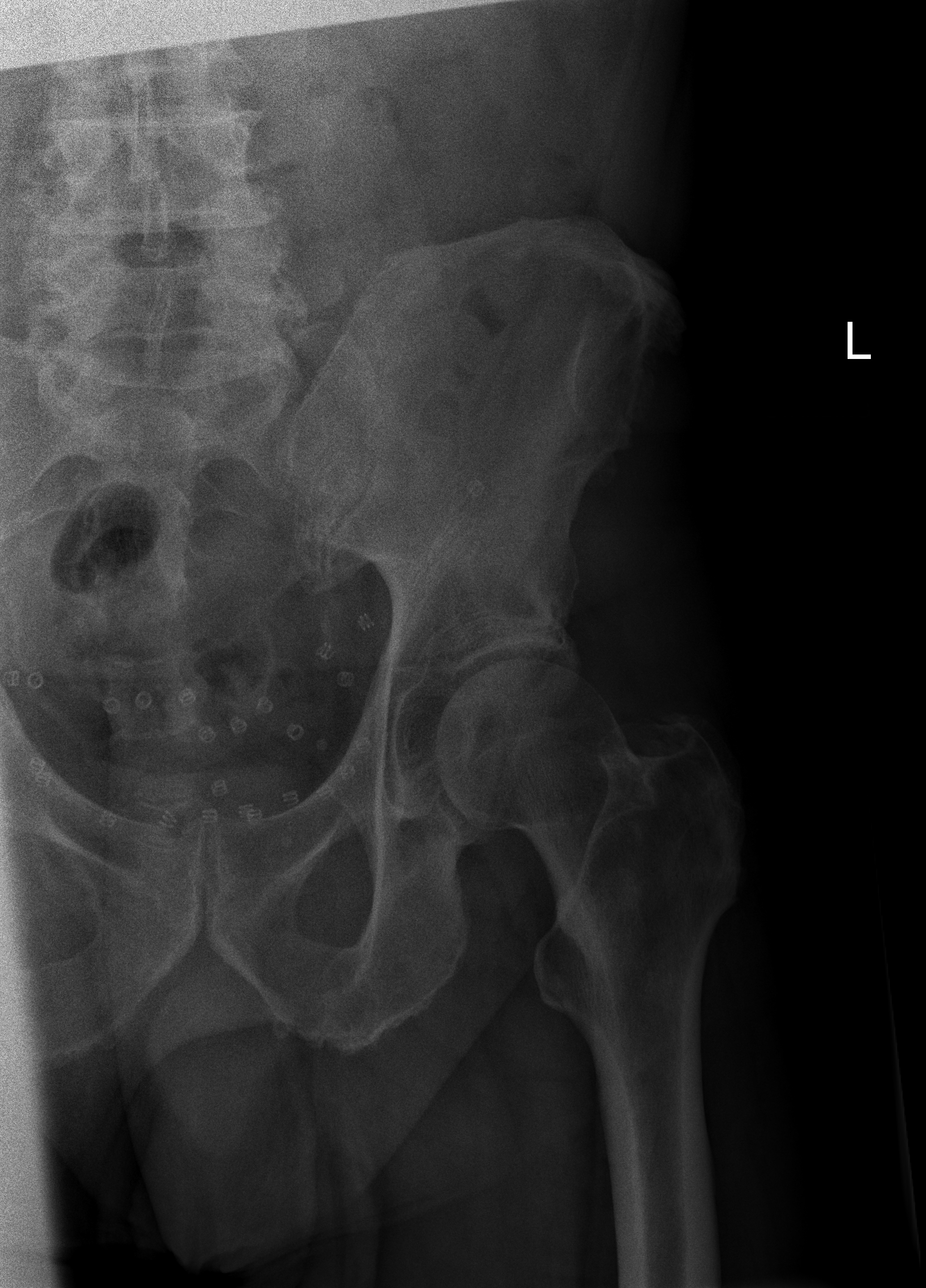

[3 of 3 positions shown; findings below may reference images not displayed]

FINDINGS: There is no evidence of hip fracture or dislocation. Mild osteophyte
formation is noted involving the left hip. No narrowing is noted.
IMPRESSION: Mild osteoarthritis of the left hip.  No acute abnormality seen.

## 2022-09-03 DIAGNOSIS — I83893 Varicose veins of bilateral lower extremities with other complications: Secondary | ICD-10-CM | POA: Diagnosis not present

## 2022-09-03 DIAGNOSIS — M79661 Pain in right lower leg: Secondary | ICD-10-CM | POA: Diagnosis not present

## 2022-09-03 DIAGNOSIS — M7989 Other specified soft tissue disorders: Secondary | ICD-10-CM | POA: Diagnosis not present

## 2022-09-03 DIAGNOSIS — M79662 Pain in left lower leg: Secondary | ICD-10-CM | POA: Diagnosis not present

## 2022-09-03 DIAGNOSIS — I83813 Varicose veins of bilateral lower extremities with pain: Secondary | ICD-10-CM | POA: Diagnosis not present

## 2022-09-03 DIAGNOSIS — M79604 Pain in right leg: Secondary | ICD-10-CM | POA: Diagnosis not present

## 2022-09-23 DIAGNOSIS — R062 Wheezing: Secondary | ICD-10-CM | POA: Diagnosis not present

## 2022-09-23 DIAGNOSIS — J069 Acute upper respiratory infection, unspecified: Secondary | ICD-10-CM | POA: Diagnosis not present

## 2022-09-29 DIAGNOSIS — R0981 Nasal congestion: Secondary | ICD-10-CM | POA: Diagnosis not present

## 2022-09-29 DIAGNOSIS — Z1389 Encounter for screening for other disorder: Secondary | ICD-10-CM | POA: Diagnosis not present

## 2022-09-29 DIAGNOSIS — Z0000 Encounter for general adult medical examination without abnormal findings: Secondary | ICD-10-CM | POA: Diagnosis not present

## 2022-09-29 DIAGNOSIS — G894 Chronic pain syndrome: Secondary | ICD-10-CM | POA: Diagnosis not present

## 2022-09-29 DIAGNOSIS — C181 Malignant neoplasm of appendix: Secondary | ICD-10-CM | POA: Diagnosis not present

## 2022-09-29 DIAGNOSIS — M5090 Cervical disc disorder, unspecified, unspecified cervical region: Secondary | ICD-10-CM | POA: Diagnosis not present

## 2022-09-29 DIAGNOSIS — M47816 Spondylosis without myelopathy or radiculopathy, lumbar region: Secondary | ICD-10-CM | POA: Diagnosis not present

## 2022-09-29 DIAGNOSIS — E782 Mixed hyperlipidemia: Secondary | ICD-10-CM | POA: Diagnosis not present

## 2022-10-20 DIAGNOSIS — I872 Venous insufficiency (chronic) (peripheral): Secondary | ICD-10-CM | POA: Diagnosis not present

## 2022-11-11 DIAGNOSIS — I87392 Chronic venous hypertension (idiopathic) with other complications of left lower extremity: Secondary | ICD-10-CM | POA: Diagnosis not present

## 2022-11-11 DIAGNOSIS — Z09 Encounter for follow-up examination after completed treatment for conditions other than malignant neoplasm: Secondary | ICD-10-CM | POA: Diagnosis not present

## 2022-11-11 DIAGNOSIS — I872 Venous insufficiency (chronic) (peripheral): Secondary | ICD-10-CM | POA: Diagnosis not present

## 2022-11-20 DIAGNOSIS — Z09 Encounter for follow-up examination after completed treatment for conditions other than malignant neoplasm: Secondary | ICD-10-CM | POA: Diagnosis not present

## 2022-11-20 DIAGNOSIS — I83891 Varicose veins of right lower extremities with other complications: Secondary | ICD-10-CM | POA: Diagnosis not present

## 2022-11-28 ENCOUNTER — Telehealth: Payer: Self-pay | Admitting: Oncology

## 2022-11-28 NOTE — Telephone Encounter (Signed)
Left patient a vm regarding upcoming appointment change  

## 2022-12-09 DIAGNOSIS — I83892 Varicose veins of left lower extremities with other complications: Secondary | ICD-10-CM | POA: Diagnosis not present

## 2022-12-17 ENCOUNTER — Other Ambulatory Visit: Payer: Medicare HMO

## 2022-12-17 ENCOUNTER — Ambulatory Visit: Payer: Medicare HMO | Admitting: Oncology

## 2022-12-23 DIAGNOSIS — I83891 Varicose veins of right lower extremities with other complications: Secondary | ICD-10-CM | POA: Diagnosis not present

## 2023-01-04 IMAGING — CT CT CHEST W/ CM
2 of 4 series · 15 of 36 positions shown, 18 images · IV contrast (omnipaque)
Comparison: Abdominopelvic CT 06/03/2021.

CLINICAL DATA: Appendiceal adenocarcinoma status post laparoscopic
appendectomy. Staging.

EXAM:
CT CHEST WITH CONTRAST
TECHNIQUE: Multidetector CT imaging of the chest was performed during
intravenous contrast administration.
CONTRAST:  75mL OMNIPAQUE IOHEXOL 350 MG/ML SOLN

[Series 2: axial st · axial · 0.83mm/px · z∈[-370,-40]mm · 12 of 195 slices shown, 15 images]
[im 15/195  mediastinal]
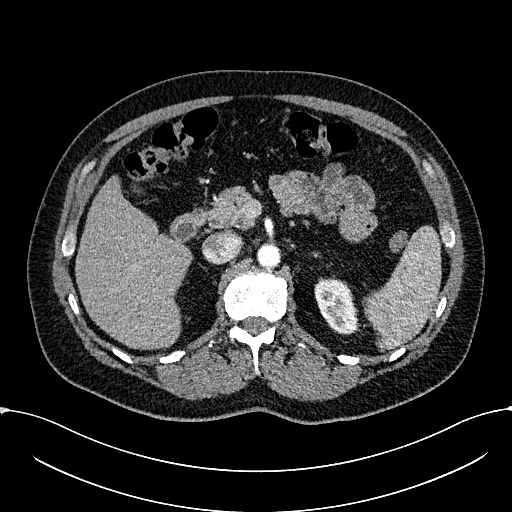
[im 15/195  lung]
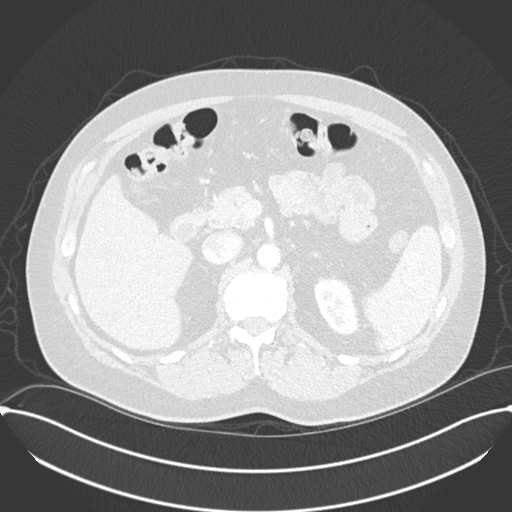
[im 30/195  lung]
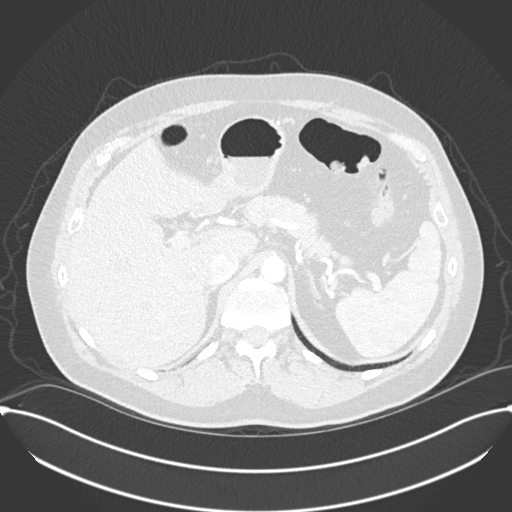
[im 45/195  lung]
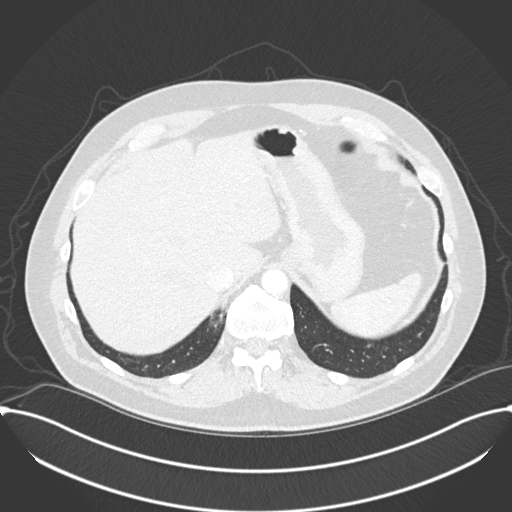
[im 60/195  lung]
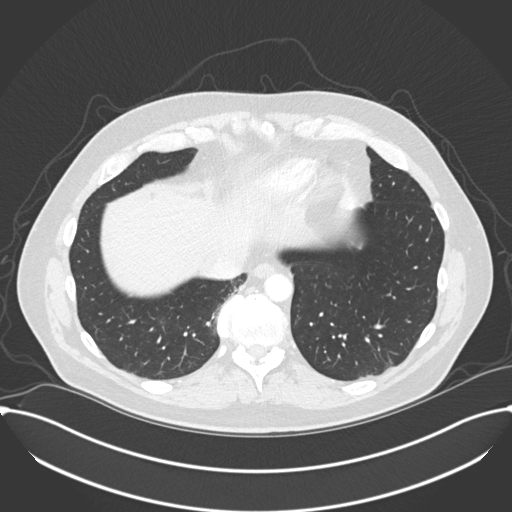
[im 75/195  mediastinal]
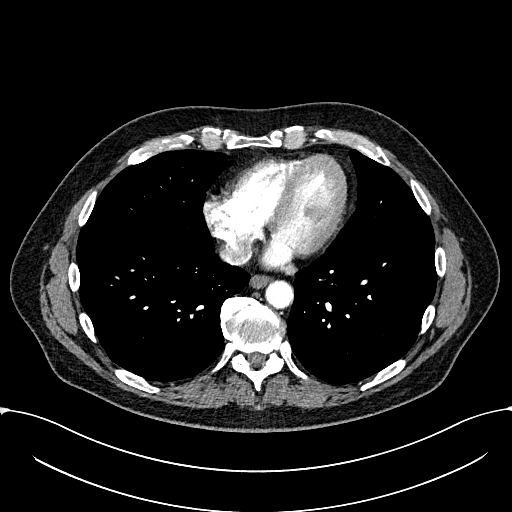
[im 75/195  lung]
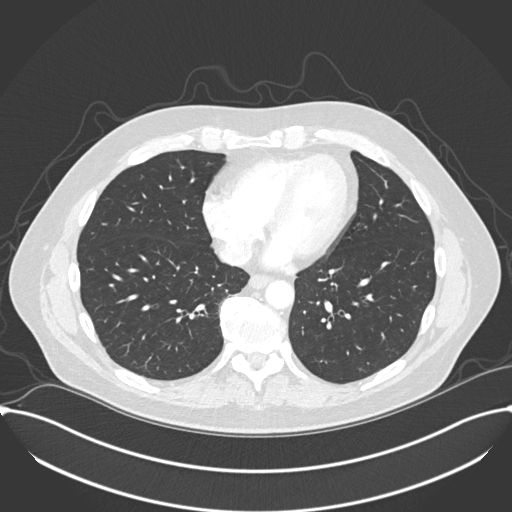
[im 90/195  lung]
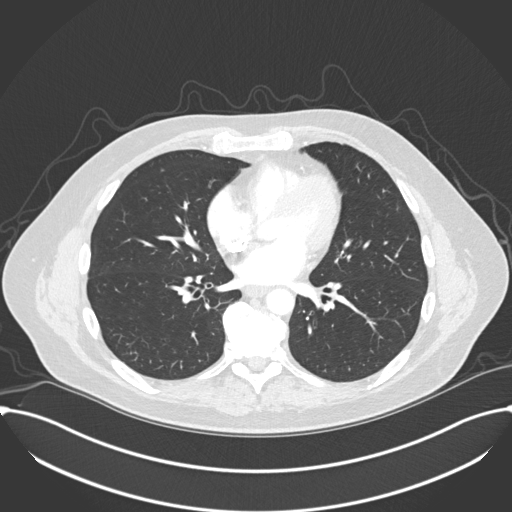
[im 105/195  lung]
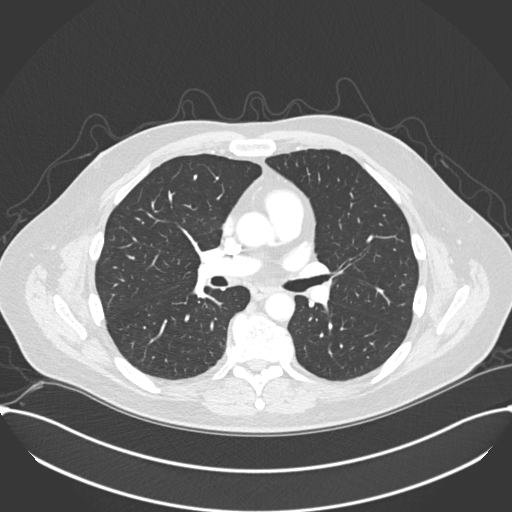
[im 120/195  lung]
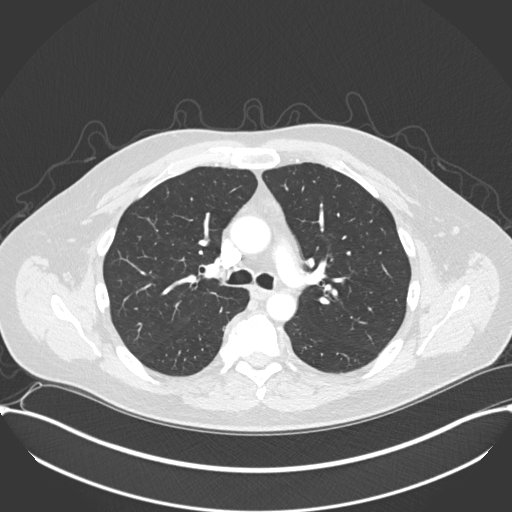
[im 135/195  mediastinal]
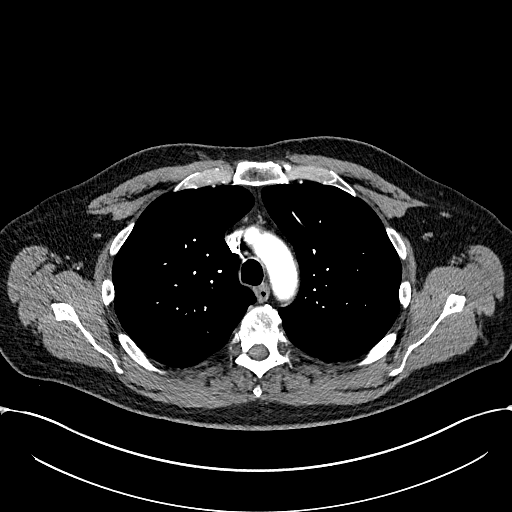
[im 135/195  lung]
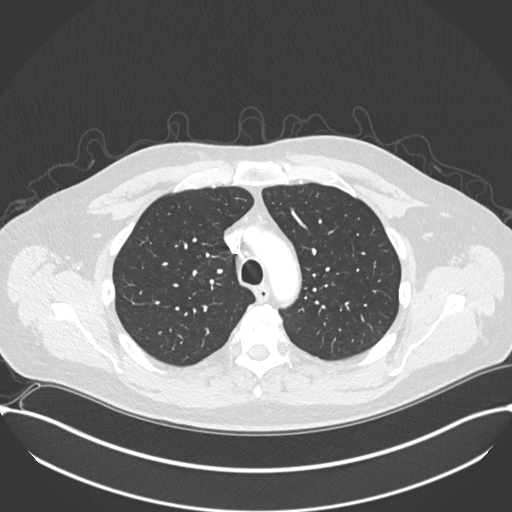
[im 150/195  lung]
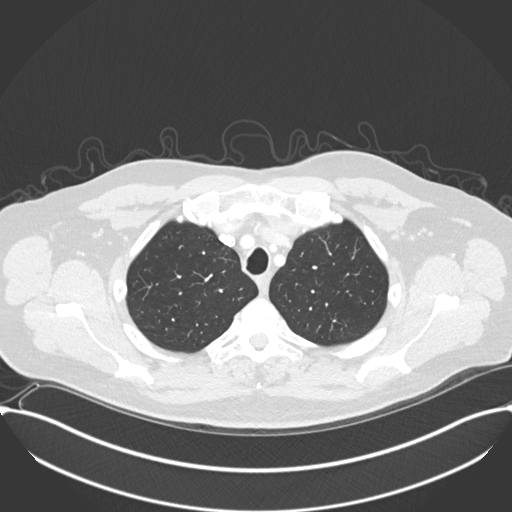
[im 165/195  lung]
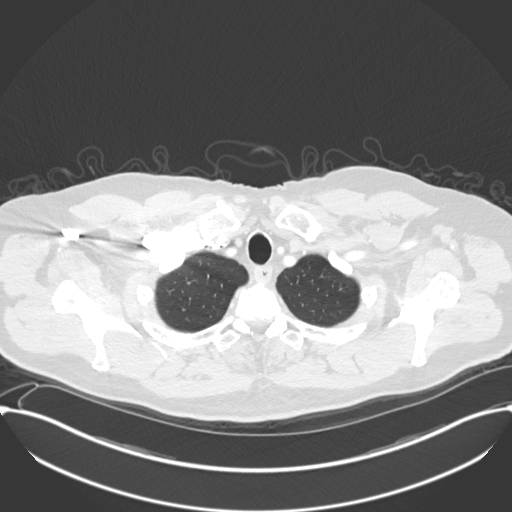
[im 180/195  lung]
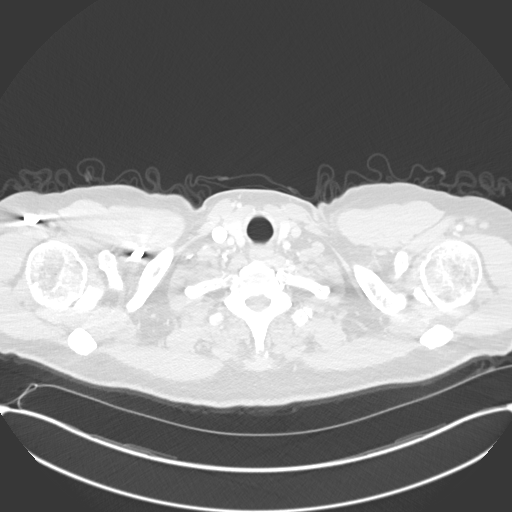

[Series 5: coronal · coronal · 0.81mm/px · 3 of 142 slices shown]
[im 29/142  lung]
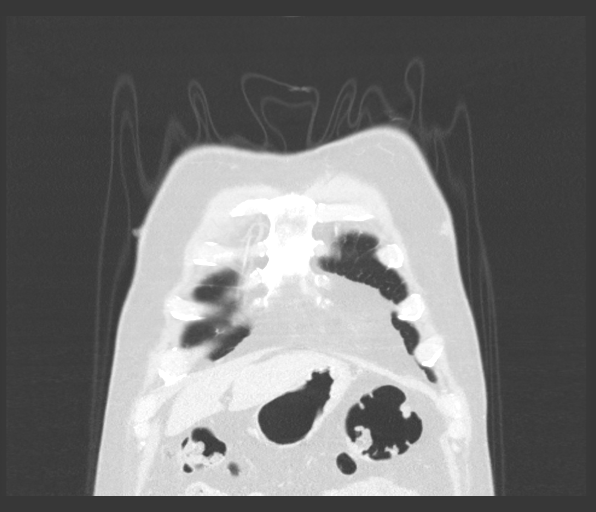
[im 57/142  lung]
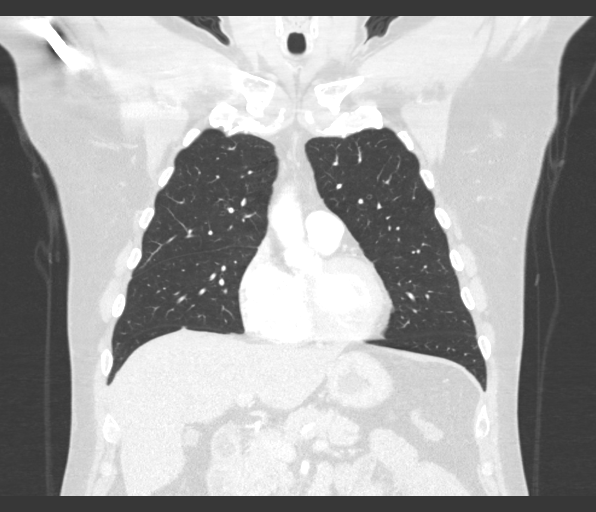
[im 85/142  lung]
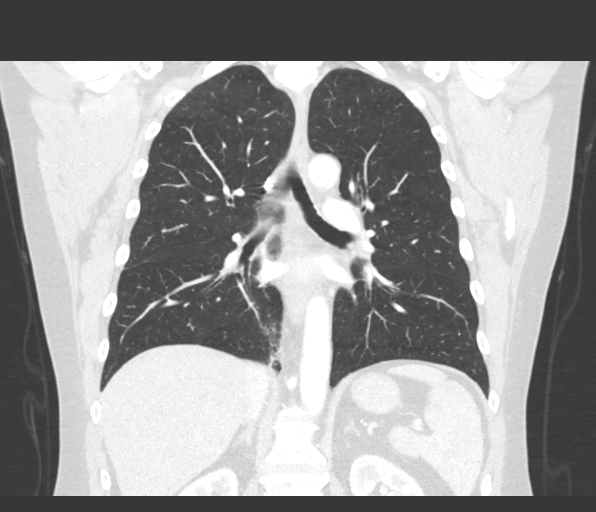

[15 of 36 positions shown; findings below may reference images not displayed]

FINDINGS: Cardiovascular: No acute vascular findings. Minimal atherosclerosis
of the aorta, great vessels and coronary arteries. The pulmonary
arteries are patent. The heart size is normal. There is no
pericardial effusion.

Mediastinum/Nodes: There are no enlarged mediastinal, hilar or
axillary lymph nodes. The thyroid gland, trachea and esophagus
demonstrate no significant findings.

Lungs/Pleura: No pleural effusion or pneumothorax. 5 mm right lower
lobe nodule on image 138/7 appears new compared with recent
abdominal CT, likely inflammatory. 4 mm perifissural nodule is noted
in the right middle lobe on image 107/7, not previously imaged, but
likely benign. No suspicious pulmonary nodules.

Upper abdomen: The visualized upper abdomen appears stable without
significant findings.

Musculoskeletal/Chest wall: There is no chest wall mass or
suspicious osseous finding.
IMPRESSION: 1. No findings suspicious for thoracic metastatic disease.
2. Small nodules at the right lung base, including 1 in the right
lower lobe which is new compared with abdominal CT of 6 weeks ago,
consistent with a focus of inflammation. Fleischner criteria do not
apply given the patient's malignancy, but these nodules do not
require any specific imaging follow-up.
3. No acute chest findings.

## 2023-01-05 ENCOUNTER — Inpatient Hospital Stay: Payer: Medicare HMO | Admitting: Oncology

## 2023-01-05 ENCOUNTER — Inpatient Hospital Stay: Payer: Medicare HMO | Attending: Oncology

## 2023-01-05 VITALS — BP 126/71 | HR 70 | Temp 98.1°F | Resp 18 | Ht 72.0 in | Wt 228.1 lb

## 2023-01-05 DIAGNOSIS — N400 Enlarged prostate without lower urinary tract symptoms: Secondary | ICD-10-CM | POA: Diagnosis not present

## 2023-01-05 DIAGNOSIS — Z8711 Personal history of peptic ulcer disease: Secondary | ICD-10-CM | POA: Diagnosis not present

## 2023-01-05 DIAGNOSIS — C181 Malignant neoplasm of appendix: Secondary | ICD-10-CM | POA: Diagnosis not present

## 2023-01-05 DIAGNOSIS — D122 Benign neoplasm of ascending colon: Secondary | ICD-10-CM | POA: Insufficient documentation

## 2023-01-05 DIAGNOSIS — D124 Benign neoplasm of descending colon: Secondary | ICD-10-CM | POA: Diagnosis not present

## 2023-01-05 DIAGNOSIS — Z8616 Personal history of COVID-19: Secondary | ICD-10-CM | POA: Insufficient documentation

## 2023-01-05 LAB — CEA (ACCESS): CEA (CHCC): 1.45 ng/mL (ref 0.00–5.00)

## 2023-01-05 NOTE — Progress Notes (Signed)
  [facility] OFFICE PROGRESS NOTE   Diagnosis: Colon cancer  INTERVAL HISTORY:   Mr. Zimmerle returns as scheduled.  He feels well.  Good appetite.  No difficulty with bowel function.  No bleeding.  He is golfing several days per week.  He had an insect bite at the posterior right leg yesterday.  Objective:  Vital signs in last 24 hours:  Blood pressure 126/71, pulse 70, temperature 98.1 F (36.7 C), resp. rate 18, height 6' (1.829 m), weight 228 lb 1.6 oz (103.5 kg), SpO2 100 %.   Lymphatics: No cervical, supraclavicular, axillary, or inguinal nodes Resp: Lungs clear bilaterally Cardio: Regular rate and rhythm GI: No mass, nontender, no hepatosplenomegaly Vascular: No leg edema  Skin: 1.5 cm area of erythema with mild induration at the upper right posterior thigh   Lab Results:  Lab Results  Component Value Date   WBC 6.1 03/10/2022   HGB 15.2 03/10/2022   HCT 45.0 03/10/2022   MCV 107.1 (H) 03/10/2022   PLT 159 03/10/2022   NEUTROABS 3.6 03/10/2022    CMP  Lab Results  Component Value Date   NA 139 06/16/2022   K 4.8 06/16/2022   CL 102 06/16/2022   CO2 31 06/16/2022   GLUCOSE 101 (H) 06/16/2022   BUN 17 06/16/2022   CREATININE 1.09 06/16/2022   CALCIUM 9.5 06/16/2022   PROT 7.3 03/10/2022   ALBUMIN 4.4 03/10/2022   AST 20 03/10/2022   ALT 18 03/10/2022   ALKPHOS 74 03/10/2022   BILITOT 0.8 03/10/2022   GFRNONAA >60 06/16/2022    Lab Results  Component Value Date   CEA1 3.0 08/14/2021   CEA 1.45 01/05/2023   Medications: I have reviewed the patient's current medications.   Assessment/Plan: Goblet cell adenocarcinoma the appendix, stage IIb(pT4a,pN0) Appendectomy 06/26/2021, 2 x 1.2 x 1 cm grade 3 goblet cell adenocarcinoma, acellular mucin invades the visceral peritoneum (pT4a), perineural invasion present, proximal margin involved by invasive carcinoma CT abdomen/pelvis 06/03/2021-newly dilated and mildly thick-walled appendix without  significant periappendiceal inflammatory changes, mild sigmoid diverticulosis, evolving postinfectious/inflammatory change in the medial basilar right lower lobe CT chest 07/15/2021-no evidence of metastatic disease, small nodules at the right lung base-1 appears inflammatory, one 4 mm perifissural nodule Right colectomy 08/14/2021-no residual malignancy, 24 negative lymph nodes Cycle 1 adjuvant Xeloda 09/09/2021 Cycle 2 adjuvant Xeloda 09/30/2021 Cycle 3 adjuvant Xeloda 10/21/2021 Cycle 4 adjuvant Xeloda 11/11/2021 Cycle 5 adjuvant Xeloda 12/02/2021 Cycle 6 adjuvant Xeloda 12/23/2021 Cycle 7 adjuvant Xeloda 01/13/2022 Cycle 8 adjuvant Xeloda 02/03/2022 Colonoscopy 06/12/2022-polyps removed from the ascending and descending colon, tubular adenoma and hyperplastic polyp CTs 06/16/2022-no evidence of recurrent disease, stable 4 mm perifissural right middle lobe nodule  History of peptic ulcer disease BPH Bilateral inguinal hernia repair Left leg superficial phlebitis November 2022 COVID-15 April 2021      Disposition: Mr. Kessner is in clinical remission from colon cancer.  He will return for an office visit and surveillance CTs in 6 months.  He will continue colonoscopy surveillance with Dr. Dulce Sellar.  Thornton Papas, MD  01/05/2023  10:43 AM

## 2023-01-07 DIAGNOSIS — I87392 Chronic venous hypertension (idiopathic) with other complications of left lower extremity: Secondary | ICD-10-CM | POA: Diagnosis not present

## 2023-01-07 DIAGNOSIS — I83892 Varicose veins of left lower extremities with other complications: Secondary | ICD-10-CM | POA: Diagnosis not present

## 2023-02-03 DIAGNOSIS — I83811 Varicose veins of right lower extremities with pain: Secondary | ICD-10-CM | POA: Diagnosis not present

## 2023-02-03 DIAGNOSIS — I83891 Varicose veins of right lower extremities with other complications: Secondary | ICD-10-CM | POA: Diagnosis not present

## 2023-02-09 DIAGNOSIS — Z125 Encounter for screening for malignant neoplasm of prostate: Secondary | ICD-10-CM | POA: Diagnosis not present

## 2023-02-16 DIAGNOSIS — N5201 Erectile dysfunction due to arterial insufficiency: Secondary | ICD-10-CM | POA: Diagnosis not present

## 2023-02-24 DIAGNOSIS — I83892 Varicose veins of left lower extremities with other complications: Secondary | ICD-10-CM | POA: Diagnosis not present

## 2023-02-24 DIAGNOSIS — I83812 Varicose veins of left lower extremities with pain: Secondary | ICD-10-CM | POA: Diagnosis not present

## 2023-03-09 DIAGNOSIS — R69 Illness, unspecified: Secondary | ICD-10-CM | POA: Diagnosis not present

## 2023-03-18 IMAGING — US US EXTREM LOW VENOUS
1 series · 14 of 24 positions shown · non-contrast
Comparison: None.

CLINICAL DATA: Bilateral lower extremity edema for 2 weeks

EXAM:
BILATERAL LOWER EXTREMITY VENOUS DOPPLER ULTRASOUND
TECHNIQUE: Gray-scale sonography with compression, as well as color and duplex
ultrasound, were performed to evaluate the deep venous system(s)
from the level of the common femoral vein through the popliteal and
proximal calf veins.

[Series 1: us venous img lower bilat (dvt) · portal-venous · 14 of 82 slices shown]
[im 1/82]
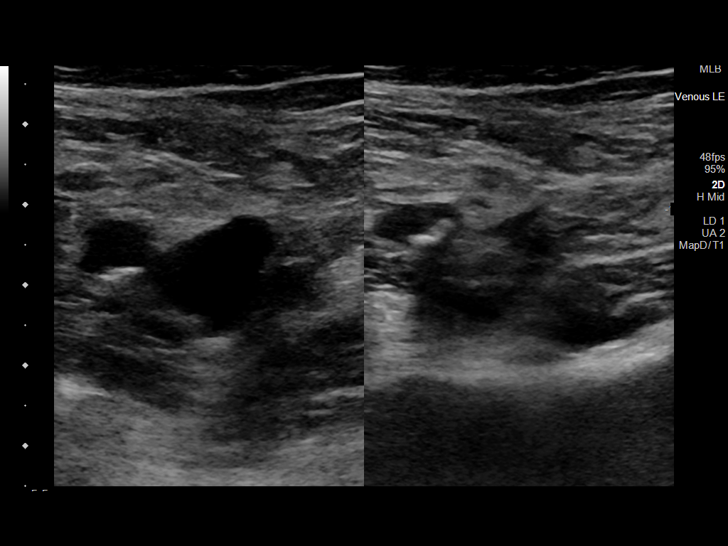
[im 8/82]
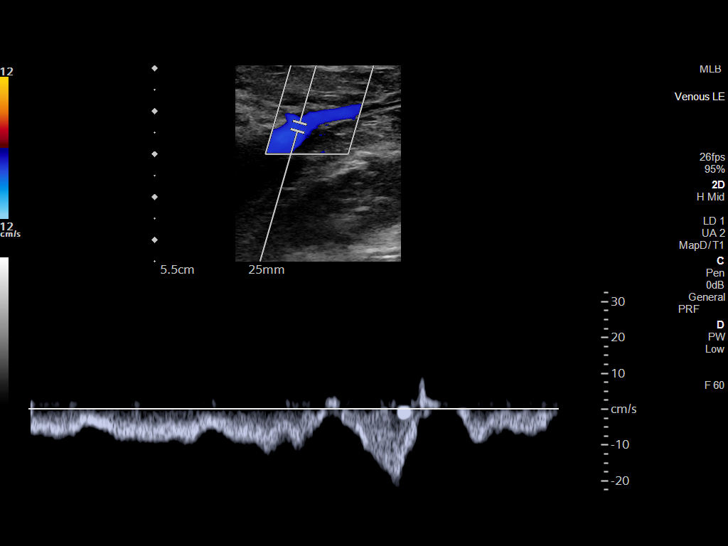
[im 15/82]
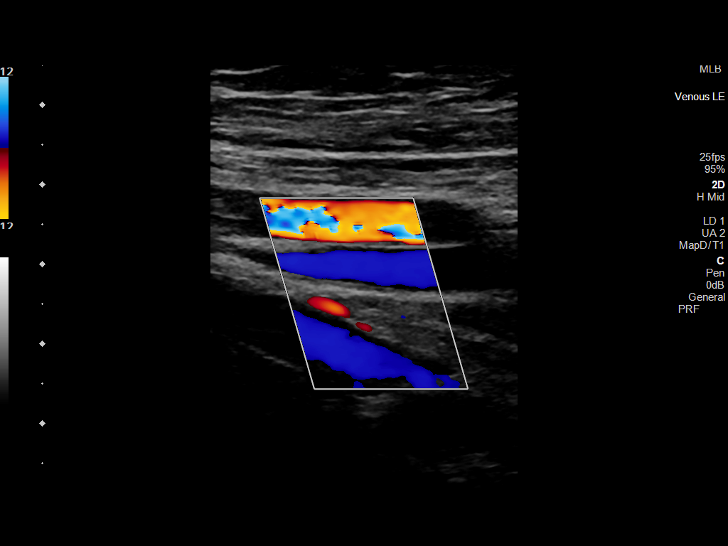
[im 22/82]
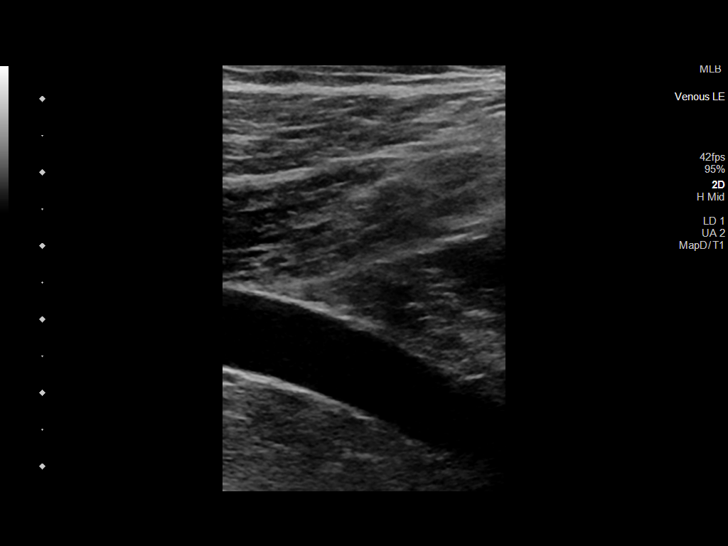
[im 25/82]
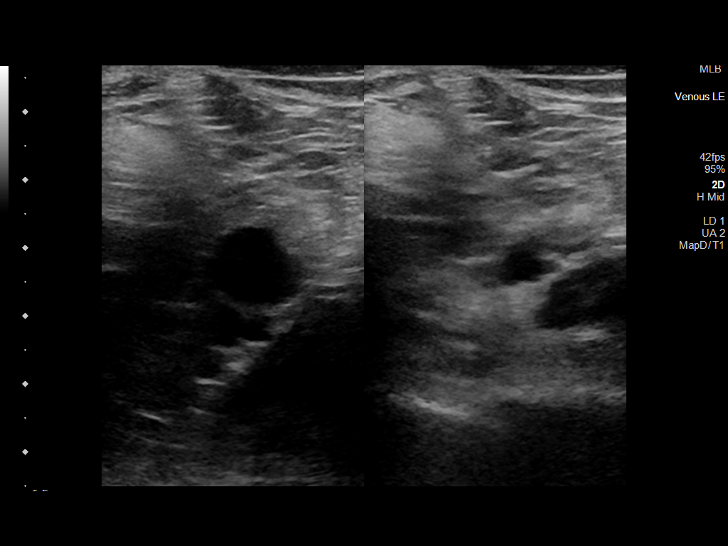
[im 32/82]
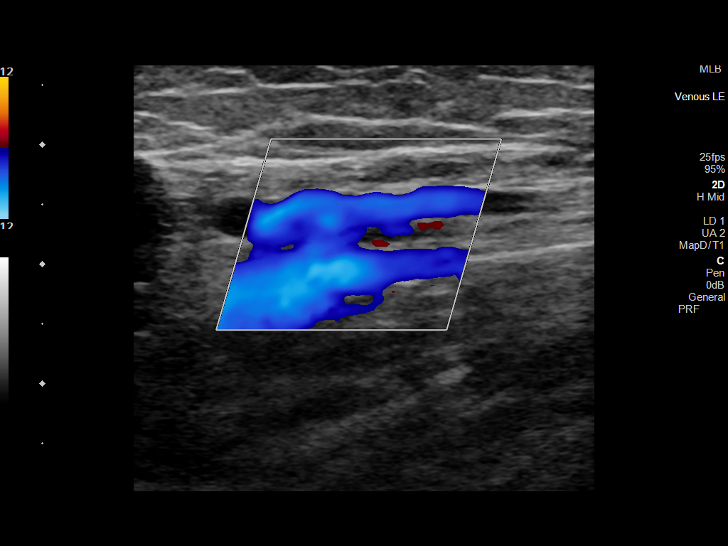
[im 39/82]
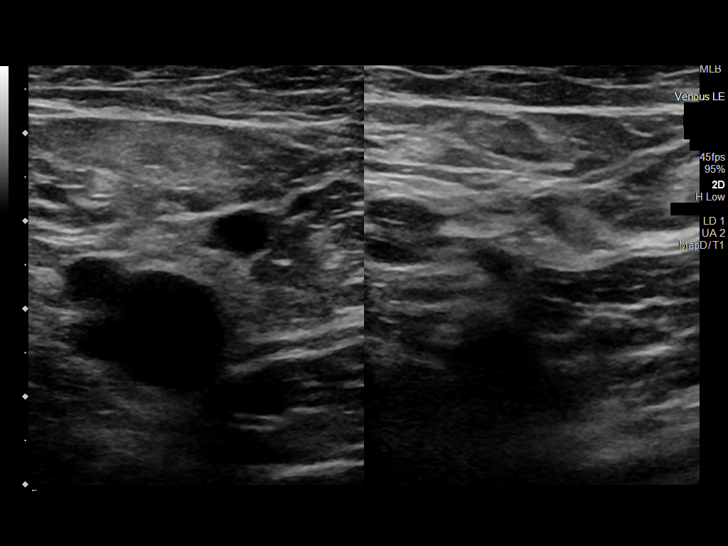
[im 43/82]
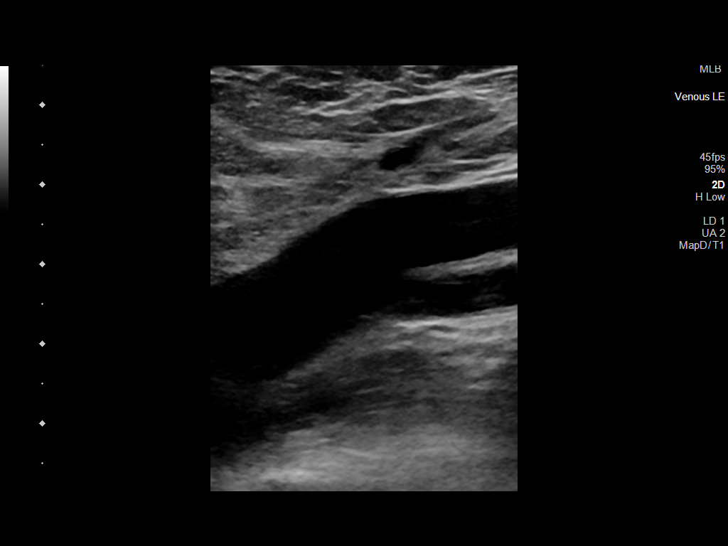
[im 50/82]
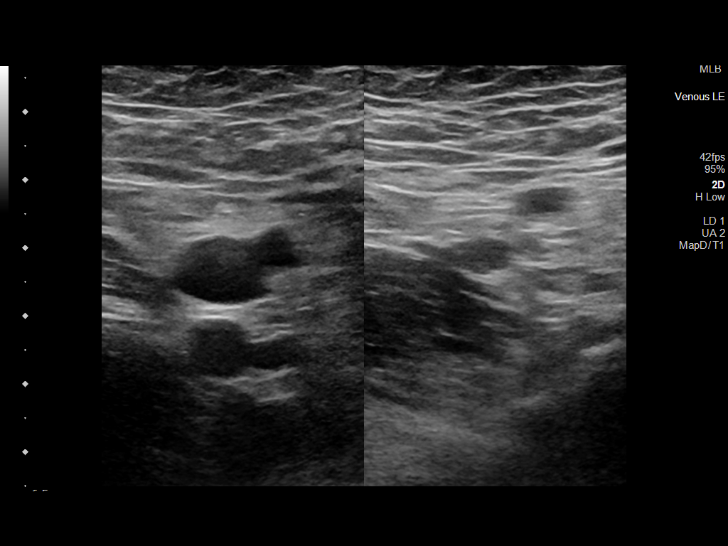
[im 57/82]
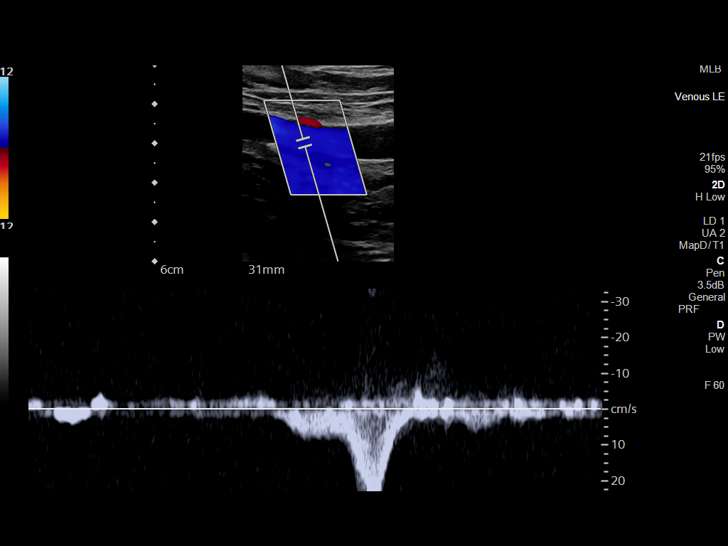
[im 64/82]
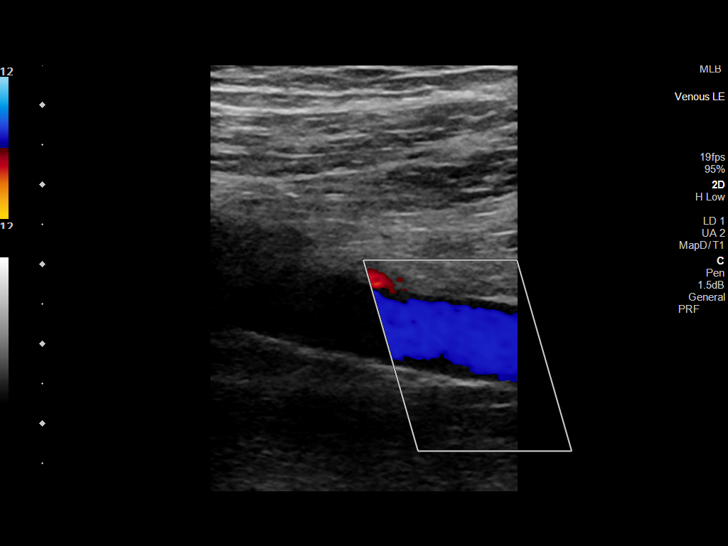
[im 67/82]
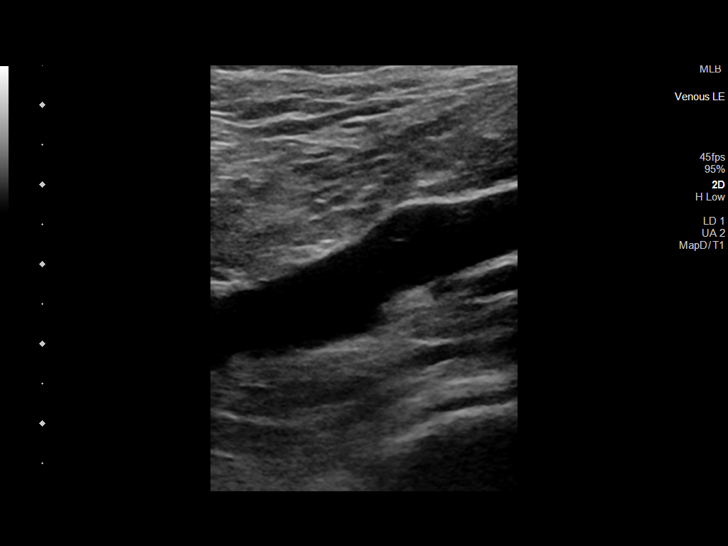
[im 74/82]
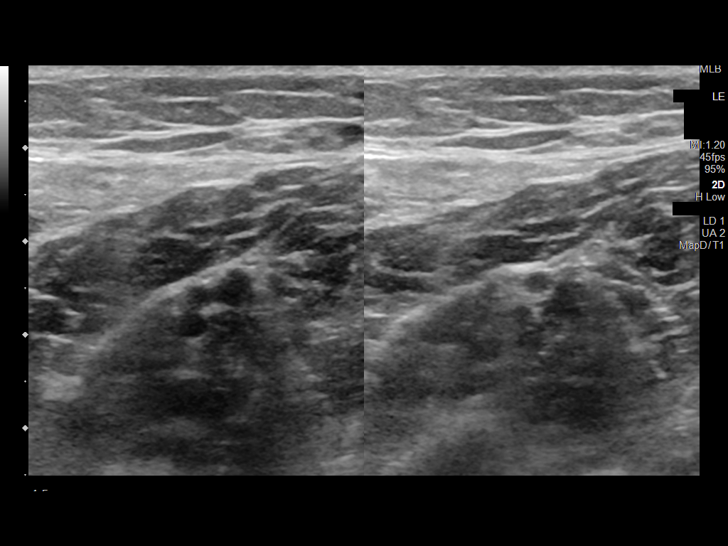
[im 82/82]
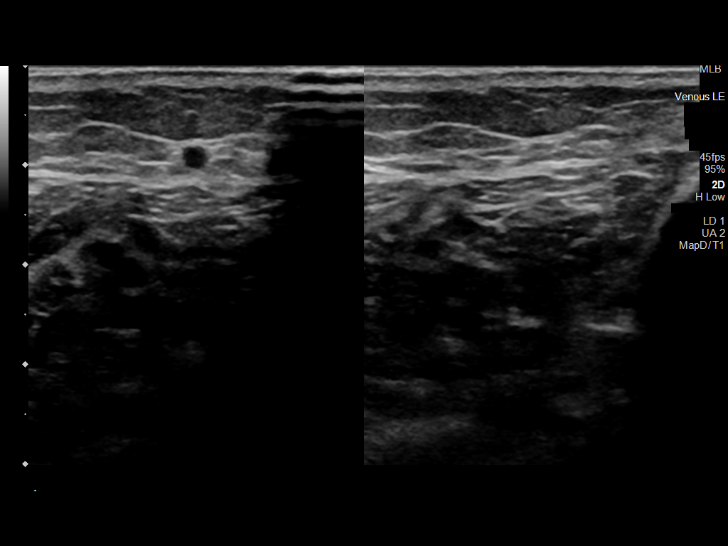

[14 of 24 positions shown; findings below may reference images not displayed]

FINDINGS: VENOUS

Normal compressibility of the common femoral, superficial femoral,
and popliteal veins, as well as the visualized calf veins.
Visualized portions of profunda femoral vein and great saphenous
vein unremarkable. No filling defects to suggest DVT on grayscale or
color Doppler imaging. Doppler waveforms show normal direction of
venous flow, normal respiratory plasticity and response to
augmentation.

OTHER

None.

Limitations: none
IMPRESSION: No lower extremity DVT.

## 2023-06-02 DIAGNOSIS — M4696 Unspecified inflammatory spondylopathy, lumbar region: Secondary | ICD-10-CM | POA: Diagnosis not present

## 2023-06-02 DIAGNOSIS — Z23 Encounter for immunization: Secondary | ICD-10-CM | POA: Diagnosis not present

## 2023-06-02 DIAGNOSIS — G894 Chronic pain syndrome: Secondary | ICD-10-CM | POA: Diagnosis not present

## 2023-06-02 DIAGNOSIS — E782 Mixed hyperlipidemia: Secondary | ICD-10-CM | POA: Diagnosis not present

## 2023-06-02 DIAGNOSIS — M79672 Pain in left foot: Secondary | ICD-10-CM | POA: Diagnosis not present

## 2023-06-02 DIAGNOSIS — M791 Myalgia, unspecified site: Secondary | ICD-10-CM | POA: Diagnosis not present

## 2023-06-08 DIAGNOSIS — D485 Neoplasm of uncertain behavior of skin: Secondary | ICD-10-CM | POA: Diagnosis not present

## 2023-06-08 DIAGNOSIS — C44519 Basal cell carcinoma of skin of other part of trunk: Secondary | ICD-10-CM | POA: Diagnosis not present

## 2023-06-08 DIAGNOSIS — W908XXS Exposure to other nonionizing radiation, sequela: Secondary | ICD-10-CM | POA: Diagnosis not present

## 2023-06-08 DIAGNOSIS — Z85828 Personal history of other malignant neoplasm of skin: Secondary | ICD-10-CM | POA: Diagnosis not present

## 2023-06-08 DIAGNOSIS — L820 Inflamed seborrheic keratosis: Secondary | ICD-10-CM | POA: Diagnosis not present

## 2023-06-08 DIAGNOSIS — L821 Other seborrheic keratosis: Secondary | ICD-10-CM | POA: Diagnosis not present

## 2023-06-08 DIAGNOSIS — L570 Actinic keratosis: Secondary | ICD-10-CM | POA: Diagnosis not present

## 2023-06-08 DIAGNOSIS — Z129 Encounter for screening for malignant neoplasm, site unspecified: Secondary | ICD-10-CM | POA: Diagnosis not present

## 2023-06-08 DIAGNOSIS — L578 Other skin changes due to chronic exposure to nonionizing radiation: Secondary | ICD-10-CM | POA: Diagnosis not present

## 2023-06-30 DIAGNOSIS — I83893 Varicose veins of bilateral lower extremities with other complications: Secondary | ICD-10-CM | POA: Diagnosis not present

## 2023-07-01 ENCOUNTER — Other Ambulatory Visit (HOSPITAL_COMMUNITY): Payer: Self-pay | Admitting: Orthopedic Surgery

## 2023-07-01 DIAGNOSIS — M2022 Hallux rigidus, left foot: Secondary | ICD-10-CM | POA: Diagnosis not present

## 2023-07-03 ENCOUNTER — Encounter (HOSPITAL_BASED_OUTPATIENT_CLINIC_OR_DEPARTMENT_OTHER): Payer: Self-pay | Admitting: Orthopedic Surgery

## 2023-07-03 ENCOUNTER — Other Ambulatory Visit: Payer: Self-pay

## 2023-07-06 ENCOUNTER — Inpatient Hospital Stay: Payer: Medicare HMO | Attending: Oncology

## 2023-07-06 ENCOUNTER — Ambulatory Visit (HOSPITAL_BASED_OUTPATIENT_CLINIC_OR_DEPARTMENT_OTHER)
Admission: RE | Admit: 2023-07-06 | Discharge: 2023-07-06 | Disposition: A | Discharge status: Home / Self Care | Payer: Medicare HMO | Source: Ambulatory Visit | Attending: Oncology | Admitting: Oncology

## 2023-07-06 ENCOUNTER — Inpatient Hospital Stay: Payer: Medicare HMO

## 2023-07-06 ENCOUNTER — Encounter (HOSPITAL_BASED_OUTPATIENT_CLINIC_OR_DEPARTMENT_OTHER): Payer: Self-pay

## 2023-07-06 DIAGNOSIS — R911 Solitary pulmonary nodule: Secondary | ICD-10-CM | POA: Diagnosis not present

## 2023-07-06 DIAGNOSIS — C181 Malignant neoplasm of appendix: Secondary | ICD-10-CM | POA: Insufficient documentation

## 2023-07-06 DIAGNOSIS — I700 Atherosclerosis of aorta: Secondary | ICD-10-CM | POA: Diagnosis not present

## 2023-07-06 DIAGNOSIS — N400 Enlarged prostate without lower urinary tract symptoms: Secondary | ICD-10-CM | POA: Insufficient documentation

## 2023-07-06 DIAGNOSIS — Z8711 Personal history of peptic ulcer disease: Secondary | ICD-10-CM | POA: Diagnosis not present

## 2023-07-06 DIAGNOSIS — J984 Other disorders of lung: Secondary | ICD-10-CM | POA: Insufficient documentation

## 2023-07-06 DIAGNOSIS — Z8616 Personal history of COVID-19: Secondary | ICD-10-CM | POA: Insufficient documentation

## 2023-07-06 LAB — BASIC METABOLIC PANEL - CANCER CENTER ONLY
Anion gap: 5 (ref 5–15)
BUN: 15 mg/dL (ref 8–23)
CO2: 32 mmol/L (ref 22–32)
Calcium: 9.1 mg/dL (ref 8.9–10.3)
Chloride: 100 mmol/L (ref 98–111)
Creatinine: 1.14 mg/dL (ref 0.61–1.24)
GFR, Estimated: >60 mL/min (ref >60)
Glucose, Bld: 100 mg/dL — ABNORMAL HIGH (ref 70–99)
Potassium: 4.4 mmol/L (ref 3.5–5.1)
Sodium: 137 mmol/L (ref 135–145)

## 2023-07-06 LAB — CEA (ACCESS): CEA (CHCC): 1.43 ng/mL (ref 0.00–5.00)

## 2023-07-06 MED ORDER — IOHEXOL 300 MG/ML  SOLN
100.0000 mL | Freq: Once | INTRAMUSCULAR | Status: AC | PRN
  Administered 2023-07-06: 100 mL via INTRAVENOUS

## 2023-07-07 ENCOUNTER — Other Ambulatory Visit (HOSPITAL_BASED_OUTPATIENT_CLINIC_OR_DEPARTMENT_OTHER): Payer: Medicare HMO

## 2023-07-08 ENCOUNTER — Inpatient Hospital Stay: Payer: Medicare HMO | Admitting: Oncology

## 2023-07-08 ENCOUNTER — Other Ambulatory Visit: Payer: Medicare HMO

## 2023-07-08 VITALS — BP 133/87 | HR 71 | Temp 98.1°F | Resp 18 | Ht 72.0 in | Wt 234.0 lb

## 2023-07-08 DIAGNOSIS — C181 Malignant neoplasm of appendix: Secondary | ICD-10-CM | POA: Diagnosis not present

## 2023-07-08 NOTE — Progress Notes (Signed)
[facility] OFFICE PROGRESS NOTE   Diagnosis: Colon cancer  INTERVAL HISTORY:   Stephen Carey returns as scheduled.  He feels well.  Good appetite.  No difficulty with bowel function.  No bleeding.  He is having right foot surgery tomorrow for removal of a bunion.  He remains anxious regarding the cancer diagnosis.  Objective:  Vital signs in last 24 hours:  Blood pressure 133/87, pulse 71, temperature 98.1 F (36.7 C), temperature source Temporal, resp. rate 18, height 6' (1.829 m), weight 234 lb (106.1 kg), SpO2 100%.     Lymphatics: No cervical, supraclavicular, axillary, or inguinal nodes Resp: Lungs clear bilaterally Cardio: Regular rate and rhythm GI: No hepatosplenomegaly, no mass, nontender Vascular: No leg edema   Lab Results:  Lab Results  Component Value Date   WBC 6.1 03/10/2022   HGB 15.2 03/10/2022   HCT 45.0 03/10/2022   MCV 107.1 (H) 03/10/2022   PLT 159 03/10/2022   NEUTROABS 3.6 03/10/2022    CMP  Lab Results  Component Value Date   NA 137 07/06/2023   K 4.4 07/06/2023   CL 100 07/06/2023   CO2 32 07/06/2023   GLUCOSE 100 (H) 07/06/2023   BUN 15 07/06/2023   CREATININE 1.14 07/06/2023   CALCIUM 9.1 07/06/2023   PROT 7.3 03/10/2022   ALBUMIN 4.4 03/10/2022   AST 20 03/10/2022   ALT 18 03/10/2022   ALKPHOS 74 03/10/2022   BILITOT 0.8 03/10/2022   GFRNONAA >60 07/06/2023    Lab Results  Component Value Date   CEA1 3.0 08/14/2021   CEA 1.43 07/06/2023    No results found for: "INR", "LABPROT"  Imaging:  CT CHEST ABDOMEN PELVIS W CONTRAST  Result Date: 07/06/2023 CLINICAL DATA:  Colon cancer surveillance.  * Tracking Code: BO * EXAM: CT CHEST, ABDOMEN, AND PELVIS WITH CONTRAST TECHNIQUE: Multidetector CT imaging of the chest, abdomen and pelvis was performed following the standard protocol during bolus administration of intravenous contrast. RADIATION DOSE REDUCTION: This exam was performed according to the departmental  dose-optimization program which includes automated exposure control, adjustment of the mA and/or kV according to patient size and/or use of iterative reconstruction technique. CONTRAST:  OMNIPAQUE IOHEXOL 300 MG/ML  SOLN COMPARISON:  Multiple priors including most recent CT June 16, 2022 FINDINGS: CT CHEST FINDINGS Cardiovascular: Aortic atherosclerosis. Normal caliber thoracic aorta. No central pulmonary embolus on this nondedicated study. Normal size heart. No significant pericardial effusion/thickening. Mediastinum/Nodes: No suspicious thyroid nodule. No pathologically enlarged mediastinal, hilar or axillary lymph nodes. The esophagus is grossly unremarkable. Lungs/Pleura: Stable 4 mm perifissural right middle lobe pulmonary nodule on image 104/4. No new suspicious pulmonary nodules or masses. Osteophyte related scarring in the paramedian right lower lobe. No pleural effusion. No pneumothorax. Musculoskeletal: No aggressive lytic or blastic lesion of bone. Bridging anterior vertebral osteophytes. CT ABDOMEN PELVIS FINDINGS Hepatobiliary: No suspicious hepatic lesion. Gallbladder is unremarkable. No biliary ductal dilation. Pancreas: No pancreatic ductal dilation or evidence of acute inflammation. Spleen: No splenomegaly. Adrenals/Urinary Tract: Bilateral adrenal glands appear normal. No hydronephrosis. Kidneys demonstrate symmetric enhancement. Urinary bladder is unremarkable for degree of distension. Stomach/Bowel: Radiopaque enteric contrast material traverses the hepatic flexure. Stomach is unremarkable for degree of distension. No pathologic dilation of small or large bowel. Prior right hemicolectomy with ileocolic anastomosis. No suspicious enhancing nodularity along the suture line. Vascular/Lymphatic: Aortic atherosclerosis. Normal caliber abdominal aorta. Smooth IVC contours. Stable 11 mm periportal lymph node on image 70/2, unchanged dating back to at least March 09 2012 and compatible with a  benign finding. No pathologically enlarged abdominal or pelvic lymph nodes. Reproductive: Status post hysterectomy. No adnexal masses. Other: No significant abdominopelvic free fluid. No discrete peritoneal or omental nodularity. Prior bilateral inguinal hernia repairs. Musculoskeletal: No aggressive lytic or blastic lesion of bone. Multilevel degenerative change of the spine. Degenerative change of the bilateral hips. IMPRESSION: 1. Prior right hemicolectomy with ileocolic anastomosis. No evidence of local recurrence or metastatic disease in the chest, abdomen or pelvis. 2. Stable 4 mm perifissural right middle lobe pulmonary nodule, likely benign. 3.  Aortic Atherosclerosis (ICD10-I70.0). Electronically Signed   By: Maudry Mayhew M.D.   On: 07/06/2023 12:06    Medications: I have reviewed the patient's current medications.   Assessment/Plan: Goblet cell adenocarcinoma the appendix, stage IIb(pT4a,pN0) Appendectomy 06/26/2021, 2 x 1.2 x 1 cm grade 3 goblet cell adenocarcinoma, acellular mucin invades the visceral peritoneum (pT4a), perineural invasion present, proximal margin involved by invasive carcinoma CT abdomen/pelvis 06/03/2021-newly dilated and mildly thick-walled appendix without significant periappendiceal inflammatory changes, mild sigmoid diverticulosis, evolving postinfectious/inflammatory change in the medial basilar right lower lobe CT chest 07/15/2021-no evidence of metastatic disease, small nodules at the right lung base-1 appears inflammatory, one 4 mm perifissural nodule Right colectomy 08/14/2021-no residual malignancy, 24 negative lymph nodes Cycle 1 adjuvant Xeloda 09/09/2021 Cycle 2 adjuvant Xeloda 09/30/2021 Cycle 3 adjuvant Xeloda 10/21/2021 Cycle 4 adjuvant Xeloda 11/11/2021 Cycle 5 adjuvant Xeloda 12/02/2021 Cycle 6 adjuvant Xeloda 12/23/2021 Cycle 7 adjuvant Xeloda 01/13/2022 Cycle 8 adjuvant Xeloda 02/03/2022 Colonoscopy 06/12/2022-polyps removed from the ascending and  descending colon, tubular adenoma and hyperplastic polyp CTs 06/16/2022-no evidence of recurrent disease, stable 4 mm perifissural right middle lobe nodule CTs 07/06/2023-no evidence of recurrent disease, stable 4 mm right middle lobe perifissural nodule  History of peptic ulcer disease BPH Bilateral inguinal hernia repair Left leg superficial phlebitis November 2022 COVID-15 April 2021     Disposition: Stephen Carey remains in clinical remission from appendix  carcinoma.  He will return for an office visit and CEA in 6 months.  Thornton Papas, MD  07/08/2023  10:18 AM

## 2023-07-09 ENCOUNTER — Other Ambulatory Visit: Payer: Self-pay

## 2023-07-09 ENCOUNTER — Ambulatory Visit (HOSPITAL_BASED_OUTPATIENT_CLINIC_OR_DEPARTMENT_OTHER): Payer: Medicare HMO | Admitting: Certified Registered Nurse Anesthetist

## 2023-07-09 ENCOUNTER — Ambulatory Visit (HOSPITAL_BASED_OUTPATIENT_CLINIC_OR_DEPARTMENT_OTHER): Payer: Medicare HMO

## 2023-07-09 ENCOUNTER — Encounter (HOSPITAL_BASED_OUTPATIENT_CLINIC_OR_DEPARTMENT_OTHER)
Admission: RE | Disposition: A | Discharge status: Home / Self Care | Payer: Self-pay | Source: Home / Self Care | Attending: Orthopedic Surgery

## 2023-07-09 ENCOUNTER — Ambulatory Visit (HOSPITAL_BASED_OUTPATIENT_CLINIC_OR_DEPARTMENT_OTHER)
Admission: RE | Admit: 2023-07-09 | Discharge: 2023-07-09 | Disposition: A | Discharge status: Home / Self Care | Payer: Medicare HMO | Attending: Orthopedic Surgery | Admitting: Orthopedic Surgery

## 2023-07-09 ENCOUNTER — Encounter (HOSPITAL_BASED_OUTPATIENT_CLINIC_OR_DEPARTMENT_OTHER): Payer: Self-pay | Admitting: Orthopedic Surgery

## 2023-07-09 DIAGNOSIS — M2022 Hallux rigidus, left foot: Secondary | ICD-10-CM

## 2023-07-09 DIAGNOSIS — M19072 Primary osteoarthritis, left ankle and foot: Secondary | ICD-10-CM | POA: Insufficient documentation

## 2023-07-09 DIAGNOSIS — Z87891 Personal history of nicotine dependence: Secondary | ICD-10-CM | POA: Diagnosis not present

## 2023-07-09 DIAGNOSIS — Z79899 Other long term (current) drug therapy: Secondary | ICD-10-CM | POA: Diagnosis not present

## 2023-07-09 DIAGNOSIS — G8929 Other chronic pain: Secondary | ICD-10-CM | POA: Insufficient documentation

## 2023-07-09 DIAGNOSIS — E785 Hyperlipidemia, unspecified: Secondary | ICD-10-CM | POA: Insufficient documentation

## 2023-07-09 DIAGNOSIS — M1990 Unspecified osteoarthritis, unspecified site: Secondary | ICD-10-CM | POA: Diagnosis not present

## 2023-07-09 DIAGNOSIS — M25775 Osteophyte, left foot: Secondary | ICD-10-CM | POA: Insufficient documentation

## 2023-07-09 DIAGNOSIS — K279 Peptic ulcer, site unspecified, unspecified as acute or chronic, without hemorrhage or perforation: Secondary | ICD-10-CM | POA: Diagnosis not present

## 2023-07-09 DIAGNOSIS — K219 Gastro-esophageal reflux disease without esophagitis: Secondary | ICD-10-CM | POA: Insufficient documentation

## 2023-07-09 DIAGNOSIS — Z7982 Long term (current) use of aspirin: Secondary | ICD-10-CM | POA: Diagnosis not present

## 2023-07-09 DIAGNOSIS — G8918 Other acute postprocedural pain: Secondary | ICD-10-CM | POA: Diagnosis not present

## 2023-07-09 HISTORY — PX: CHEILECTOMY: SHX1336

## 2023-07-09 SURGERY — CHEILECTOMY
Anesthesia: General | Site: Foot | Laterality: Left

## 2023-07-09 MED ORDER — FENTANYL CITRATE (PF) 100 MCG/2ML IJ SOLN: INTRAMUSCULAR | Status: DC | PRN

## 2023-07-09 MED ORDER — LIDOCAINE 2% (20 MG/ML) 5 ML SYRINGE
INTRAMUSCULAR | Status: AC
  Filled 2023-07-09: qty 5

## 2023-07-09 MED ORDER — FENTANYL CITRATE (PF) 100 MCG/2ML IJ SOLN
INTRAMUSCULAR | Status: AC
  Filled 2023-07-09: qty 2

## 2023-07-09 MED ORDER — PHENYLEPHRINE HCL (PRESSORS) 10 MG/ML IV SOLN
INTRAVENOUS | Status: DC | PRN
  Administered 2023-07-09 (×2): 80 ug via INTRAVENOUS

## 2023-07-09 MED ORDER — MIDAZOLAM HCL 2 MG/2ML IJ SOLN
INTRAMUSCULAR | Status: AC
Start: 2023-07-09 — End: ?
  Filled 2023-07-09: qty 2

## 2023-07-09 MED ORDER — ACETAMINOPHEN 500 MG PO TABS
ORAL_TABLET | ORAL | Status: AC
  Filled 2023-07-09: qty 2

## 2023-07-09 MED ORDER — MIDAZOLAM HCL 2 MG/2ML IJ SOLN
2.0000 mg | Freq: Once | INTRAMUSCULAR | Status: AC
  Administered 2023-07-09: 2 mg via INTRAVENOUS

## 2023-07-09 MED ORDER — PROPOFOL 10 MG/ML IV BOLUS
INTRAVENOUS | Status: AC
  Filled 2023-07-09: qty 20

## 2023-07-09 MED ORDER — BUPIVACAINE-EPINEPHRINE (PF) 0.5% -1:200000 IJ SOLN
INTRAMUSCULAR | Status: DC | PRN
  Administered 2023-07-09: 30 mL via PERINEURAL

## 2023-07-09 MED ORDER — AMISULPRIDE (ANTIEMETIC) 5 MG/2ML IV SOLN: 10.0000 mg | Freq: Once | INTRAVENOUS | Status: DC | PRN

## 2023-07-09 MED ORDER — 0.9 % SODIUM CHLORIDE (POUR BTL) OPTIME
TOPICAL | Status: DC | PRN
  Administered 2023-07-09: 100 mL

## 2023-07-09 MED ORDER — ONDANSETRON HCL 4 MG/2ML IJ SOLN
INTRAMUSCULAR | Status: DC | PRN
  Administered 2023-07-09: 4 mg via INTRAVENOUS

## 2023-07-09 MED ORDER — ACETAMINOPHEN 500 MG PO TABS
1000.0000 mg | ORAL_TABLET | Freq: Once | ORAL | Status: AC
Start: 2023-07-09 — End: 2023-07-09
  Administered 2023-07-09: 1000 mg via ORAL

## 2023-07-09 MED ORDER — ROPIVACAINE HCL 5 MG/ML IJ SOLN
INTRAMUSCULAR | Status: DC | PRN
  Administered 2023-07-09: 20 mL via PERINEURAL

## 2023-07-09 MED ORDER — DEXMEDETOMIDINE HCL IN NACL 80 MCG/20ML IV SOLN
INTRAVENOUS | Status: DC | PRN
  Administered 2023-07-09: 8 ug via INTRAVENOUS

## 2023-07-09 MED ORDER — DEXAMETHASONE SODIUM PHOSPHATE 4 MG/ML IJ SOLN
INTRAMUSCULAR | Status: DC | PRN
  Administered 2023-07-09: 5 mg via INTRAVENOUS

## 2023-07-09 MED ORDER — LIDOCAINE HCL (CARDIAC) PF 100 MG/5ML IV SOSY
PREFILLED_SYRINGE | INTRAVENOUS | Status: DC | PRN
  Administered 2023-07-09: 30 mg via INTRAVENOUS

## 2023-07-09 MED ORDER — EPHEDRINE SULFATE (PRESSORS) 50 MG/ML IJ SOLN
INTRAMUSCULAR | Status: DC | PRN
  Administered 2023-07-09 (×2): 10 mg via INTRAVENOUS

## 2023-07-09 MED ORDER — LACTATED RINGERS IV SOLN: INTRAVENOUS | Status: DC

## 2023-07-09 MED ORDER — SODIUM CHLORIDE 0.9 % IV SOLN
INTRAVENOUS | Status: AC | PRN
  Administered 2023-07-09: 500 mL via INTRAMUSCULAR

## 2023-07-09 MED ORDER — FENTANYL CITRATE (PF) 100 MCG/2ML IJ SOLN: 25.0000 ug | INTRAMUSCULAR | Status: DC | PRN

## 2023-07-09 MED ORDER — DEXAMETHASONE SODIUM PHOSPHATE 10 MG/ML IJ SOLN
INTRAMUSCULAR | Status: AC
  Filled 2023-07-09: qty 1

## 2023-07-09 MED ORDER — MIDAZOLAM HCL 5 MG/5ML IJ SOLN
INTRAMUSCULAR | Status: DC | PRN
  Administered 2023-07-09: 2 mg via INTRAVENOUS

## 2023-07-09 MED ORDER — ONDANSETRON HCL 4 MG/2ML IJ SOLN
INTRAMUSCULAR | Status: AC
  Filled 2023-07-09: qty 2

## 2023-07-09 MED ORDER — CLONIDINE HCL (ANALGESIA) 100 MCG/ML EP SOLN
EPIDURAL | Status: DC | PRN
  Administered 2023-07-09 (×2): 50 ug

## 2023-07-09 MED ORDER — CEFAZOLIN SODIUM-DEXTROSE 2-4 GM/100ML-% IV SOLN
2.0000 g | INTRAVENOUS | Status: AC
  Administered 2023-07-09: 2 g via INTRAVENOUS

## 2023-07-09 MED ORDER — PROPOFOL 10 MG/ML IV BOLUS
INTRAVENOUS | Status: DC | PRN
  Administered 2023-07-09: 300 mg via INTRAVENOUS

## 2023-07-09 MED ORDER — CEFAZOLIN SODIUM-DEXTROSE 2-4 GM/100ML-% IV SOLN
INTRAVENOUS | Status: AC
  Filled 2023-07-09: qty 100

## 2023-07-09 MED ORDER — FENTANYL CITRATE (PF) 100 MCG/2ML IJ SOLN
100.0000 ug | Freq: Once | INTRAMUSCULAR | Status: AC
  Administered 2023-07-09: 100 ug via INTRAVENOUS

## 2023-07-09 SURGICAL SUPPLY — 47 items
BLADE AVERAGE 25X9 (BLADE) ×1 IMPLANT
BLADE MINI RND TIP GREEN BEAV (BLADE) IMPLANT
BLADE OSC/SAG .038X5.5 CUT EDG (BLADE) IMPLANT
BLADE SURG 15 STRL LF DISP TIS (BLADE) ×2 IMPLANT
BNDG ELASTIC 4INX 5YD STR LF (GAUZE/BANDAGES/DRESSINGS) ×1 IMPLANT
BNDG STRETCH GAUZE 3IN X12FT (GAUZE/BANDAGES/DRESSINGS) ×1 IMPLANT
BUR MIS STRT WEDGE 2.9X13 (BURR) IMPLANT
BURR MIS STRT WEDGE 2.9X13 (BURR) ×1
CHLORAPREP W/TINT 26 (MISCELLANEOUS) ×1 IMPLANT
COVER BACK TABLE 60X90IN (DRAPES) ×1 IMPLANT
CUFF TOURN SGL QUICK 18X4 (TOURNIQUET CUFF) IMPLANT
CUFF TRNQT CYL 34X4.125X (TOURNIQUET CUFF) IMPLANT
DRAPE EXTREMITY T 121X128X90 (DISPOSABLE) ×1 IMPLANT
DRAPE OEC MINIVIEW 54X84 (DRAPES) IMPLANT
DRAPE U-SHAPE 47X51 STRL (DRAPES) IMPLANT
DRSG MEPITEL 4X7.2 (GAUZE/BANDAGES/DRESSINGS) ×1 IMPLANT
ELECT REM PT RETURN 9FT ADLT (ELECTROSURGICAL) ×1
ELECTRODE REM PT RTRN 9FT ADLT (ELECTROSURGICAL) ×1 IMPLANT
GAUZE PAD ABD 8X10 STRL (GAUZE/BANDAGES/DRESSINGS) ×1 IMPLANT
GAUZE SPONGE 4X4 12PLY STRL (GAUZE/BANDAGES/DRESSINGS) ×1 IMPLANT
GAUZE STRETCH 2X75IN STRL (MISCELLANEOUS) IMPLANT
GLOVE BIO SURGEON STRL SZ8 (GLOVE) ×1 IMPLANT
GLOVE BIOGEL PI IND STRL 8 (GLOVE) ×2 IMPLANT
GLOVE ECLIPSE 8.0 STRL XLNG CF (GLOVE) ×1 IMPLANT
GOWN STRL REUS W/ TWL LRG LVL3 (GOWN DISPOSABLE) ×1 IMPLANT
GOWN STRL REUS W/ TWL XL LVL3 (GOWN DISPOSABLE) ×2 IMPLANT
NDL HYPO 25X1 1.5 SAFETY (NEEDLE) IMPLANT
NEEDLE HYPO 25X1 1.5 SAFETY (NEEDLE) IMPLANT
NS IRRIG 1000ML POUR BTL (IV SOLUTION) ×1 IMPLANT
PACK BASIN DAY SURGERY FS (CUSTOM PROCEDURE TRAY) ×1 IMPLANT
PAD CAST 4YDX4 CTTN HI CHSV (CAST SUPPLIES) ×1 IMPLANT
PENCIL SMOKE EVACUATOR (MISCELLANEOUS) ×1 IMPLANT
SANITIZER HAND PURELL FF 515ML (MISCELLANEOUS) ×1 IMPLANT
SET IRRIGATION TUBING (TUBING) IMPLANT
SHEET MEDIUM DRAPE 40X70 STRL (DRAPES) ×1 IMPLANT
SLEEVE SCD COMPRESS KNEE MED (STOCKING) ×1 IMPLANT
SPONGE T-LAP 18X18 ~~LOC~~+RFID (SPONGE) ×1 IMPLANT
STOCKINETTE 6 STRL (DRAPES) ×1 IMPLANT
SUCTION TUBE FRAZIER 10FR DISP (SUCTIONS) IMPLANT
SUT ETHILON 3 0 PS 1 (SUTURE) ×1 IMPLANT
SUT MNCRL AB 3-0 PS2 18 (SUTURE) ×1 IMPLANT
SUT VIC AB 2-0 SH 27XBRD (SUTURE) IMPLANT
SYR BULB EAR ULCER 3OZ GRN STR (SYRINGE) ×1 IMPLANT
SYR CONTROL 10ML LL (SYRINGE) IMPLANT
TOWEL GREEN STERILE FF (TOWEL DISPOSABLE) ×1 IMPLANT
TUBE CONNECTING 20X1/4 (TUBING) IMPLANT
UNDERPAD 30X36 HEAVY ABSORB (UNDERPADS AND DIAPERS) ×1 IMPLANT

## 2023-07-09 NOTE — Op Note (Signed)
07/09/2023  9:55 AM  PATIENT:  Stephen Carey  67 y.o. male  PRE-OPERATIVE DIAGNOSIS:  Acquired left hallux rigidus  POST-OPERATIVE DIAGNOSIS:  Acquired left hallux rigidus  Procedure(s): 1.  Minimally invasive left hallux MP joint cheilectomy   2.  Left foot AP and lateral radiographs  SURGEON:  Toni Arthurs, MD  ASSISTANT: Alfredo Martinez, PA-C  ANESTHESIA:   General, regional  EBL:  minimal   TOURNIQUET: None  COMPLICATIONS:  None apparent  DISPOSITION:  Extubated, awake and stable to recovery.  INDICATION FOR PROCEDURE: 67 year old male without significant past medical history complains of worsening left forefoot pain.  He has severe hallux rigidus with a large dorsal osteophyte at the metatarsal head.  There is a small osteophyte at the dorsal aspect of the proximal phalanx.  He has failed nonoperative treatment including activity modification, oral anti-inflammatories and shoewear modification.  He presents today for surgical treatment.  He specifically elects cheilectomy in attempt to preserve hallux MP joint motion.  The risks and benefits of the alternative treatment options have been discussed in detail.  The patient wishes to proceed with surgery and specifically understands risks of bleeding, infection, nerve damage, blood clots, need for additional surgery, amputation and death.   PROCEDURE IN DETAIL:  After pre operative consent was obtained, and the correct operative site was identified, the patient was brought to the operating room and placed supine on the OR table.  Anesthesia was administered.  Pre-operative antibiotics were administered.  A surgical timeout was taken.  The left lower extremity was prepped and draped in standard sterile fashion.  An incision was made at the dorsal medial first ray 2 cm proximal from the dorsal osteophyte.  A straight periosteal elevator was used to create a working space dorsal to the cortical bone of the metatarsal from the incision to  the base of the osteophyte.  A 2.9 mm conical bur was then inserted and advanced into the bone of the osteophyte in line with the metatarsal shaft angling slightly down into the joint surface.  The bur was then passed medially and laterally and then dorsally to morselized the entire osteophyte.  Intermittently bone paste was expressed from the incision.  The bur was advanced to the osteophyte at the base of the proximal phalanx which was also morselized.  The wound was irrigated copiously removing all fragments.  A rasp was then advanced into the working space.  The cut surface of bone was smoothed with a rasp.  Bone fragments were removed by rasping the undersurface of the joint capsule.  A synovial rondure was inserted and used to grasp fragments of subchondral bone.  Dorsiflexion was improved to approximately 45 degrees.  AP and lateral radiographs were obtained confirming adequate cheilectomy as well as removal of all radiopaque bone debris.  The wound and incision were irrigated a final time.  The incision was closed with 3-0 nylon.  Sterile dressings were applied followed by compression wrap.  The patient was awakened from anesthesia and transported to the recovery room in stable condition.   FOLLOW UP PLAN: Weightbearing as tolerated in a flat postop shoe.  He may remove the dressing in 3 days and put a Band-Aid over the incision.  He will continue to compress with an Ace wrap and work on active and passive range of motion.  Follow-up in 2 weeks for suture removal.  No indication for DVT prophylaxis in this ambulatory patient.   RADIOGRAPHS: AP and lateral radiographs of the left foot  are obtained intraoperatively.  These show interval resection of dorsal osteophytes from the hallux MP joint at both the proximal phalanx and head of the first metatarsal.  No other acute injuries are noted.  The AP view shows significant degenerative change in the hallux MP joint.    Alfredo Martinez PA-C was present and  scrubbed for the duration of the operative case. His assistance was essential in positioning the patient, prepping and draping, gaining and maintaining exposure, performing the operation, closing and dressing the wounds and applying the splint.

## 2023-07-09 NOTE — Anesthesia Procedure Notes (Signed)
Anesthesia Regional Block: Adductor canal block   Pre-Anesthetic Checklist: , timeout performed,  Correct Patient, Correct Site, Correct Laterality,  Correct Procedure, Correct Position, site marked,  Risks and benefits discussed,  Surgical consent,  Pre-op evaluation,  At surgeon's request and post-op pain management  Laterality: Left  Prep: chloraprep       Needles:  Injection technique: Single-shot  Needle Type: Echogenic Needle     Needle Length: 9cm  Needle Gauge: 21     Additional Needles:   Procedures:,,,, ultrasound used (permanent image in chart),,    Narrative:  Start time: 07/09/2023 8:35 AM End time: 07/09/2023 8:38 AM Injection made incrementally with aspirations every 5 mL.  Performed by: Personally  Anesthesiologist: Marcene Duos, MD

## 2023-07-09 NOTE — Anesthesia Postprocedure Evaluation (Signed)
Anesthesia Post Note  Patient: Stephen Carey  Procedure(s) Performed: LEFT HALLUX METATARSALPHALANGEAL JOINT, MINIMALLY INVASIVE CHEILECTOMY (Left: Foot)     Patient location during evaluation: PACU Anesthesia Type: General Level of consciousness: awake and alert Pain management: pain level controlled Vital Signs Assessment: post-procedure vital signs reviewed and stable Respiratory status: spontaneous breathing, nonlabored ventilation, respiratory function stable and patient connected to nasal cannula oxygen Cardiovascular status: blood pressure returned to baseline and stable Postop Assessment: no apparent nausea or vomiting Anesthetic complications: no  No notable events documented.  Last Vitals:  Vitals:   07/09/23 1027 07/09/23 1045  BP: (!) 145/73 (!) 139/59  Pulse: 67 66  Resp: 14 16  Temp:  (!) 36.2 C  SpO2: 99% 97%    Last Pain:  Vitals:   07/09/23 1045  TempSrc:   PainSc: 0-No pain    LLE Motor Response: No movement due to regional block (07/09/23 1045) LLE Sensation: Decreased;Numbness (07/09/23 1045)          Kennieth Rad

## 2023-07-09 NOTE — Anesthesia Procedure Notes (Signed)
Anesthesia Regional Block: Popliteal block   Pre-Anesthetic Checklist: , timeout performed,  Correct Patient, Correct Site, Correct Laterality,  Correct Procedure, Correct Position, site marked,  Risks and benefits discussed,  Surgical consent,  Pre-op evaluation,  At surgeon's request and post-op pain management  Laterality: Left  Prep: chloraprep       Needles:  Injection technique: Single-shot  Needle Type: Echogenic Needle     Needle Length: 9cm  Needle Gauge: 21     Additional Needles:   Procedures:,,,, ultrasound used (permanent image in chart),,    Narrative:  Start time: 07/09/2023 8:28 AM End time: 07/09/2023 8:35 AM Injection made incrementally with aspirations every 5 mL.  Performed by: Personally  Anesthesiologist: Marcene Duos, MD

## 2023-07-09 NOTE — H&P (Signed)
Stephen Carey is an 67 y.o. male.   Chief Complaint: Left foot pain HPI: 67 y/o male with a h/o worsening left foot pain due to hallux rigidus.  He has failed non op treatment and present today for left hallux MPJ cheilectomy.  Past Medical History:  Diagnosis Date   BPH (benign prostatic hyperplasia)    BPH (benign prostatic hyperplasia)    Cancer (HCC)    Cervical spinal stenosis    Dr Danielle Dess   Chronic pain disorder    Diverticulosis    DJD (degenerative joint disease)    multiple sites   GERD (gastroesophageal reflux disease)    Hyperlipidemia    Inguinal hernia    bilateral inguinal herniorrhaphy Dr Wenda Low   Osteoarthritis    generalized   PUD (peptic ulcer disease)    nsaid related 2013, [place]    Past Surgical History:  Procedure Laterality Date   CATARACT EXTRACTION Bilateral 10/2020   CYST EXCISION Right 10/03/2019   Procedure: EXCISION MUCOID CYST WITH INTERPHALAGEAL JOINT ARTHROTOMY RIGHT THUMB;  Surgeon: Betha Loa, MD;  Location: [facility];  Service: Orthopedics;  Laterality: Right;  Bier block   ELBOW SURGERY Bilateral    bursectomy   ESOPHAGOGASTRODUODENOSCOPY     FOOT SURGERY Right    HAND SURGERY Left    HERNIA REPAIR Bilateral    inguinal   KNEE ARTHROSCOPY Right    KNEE SURGERY Left    [place]   LAPAROSCOPIC APPENDECTOMY N/A 06/26/2021   Procedure: LAPAROSCOPIC APPENDECTOMY;  Surgeon: Abigail Miyamoto, MD;  Location: [facility] OR;  Service: General;  Laterality: N/A;   ROTATOR CUFF REPAIR Left    left, Dr Eulah Pont   SHOULDER ARTHROSCOPY Left    VASECTOMY     Dr Isabel Caprice    Family History  Problem Relation Age of Onset   COPD Mother    Pancreatic cancer Father    Liver cancer Father    Hyperlipidemia Sister    Epilepsy Brother    Social History:  reports that he quit smoking about 32 years ago. His smoking use included cigarettes. He started smoking about 62 years ago. He has never used smokeless tobacco. He reports that he does not  drink alcohol and does not use drugs.  Allergies:  Allergies  Allergen Reactions   Sulfa Antibiotics Hives and Other (See Comments)    rash   Atorvastatin     myalgias   Doxycycline     REACTION: severe diarrhea   Oxycodone-Acetaminophen     GI upset Other reaction(s): GI upset   Penicillin G     Other reaction(s): unknown   Penicillins     REACTION: as child   Sulfonamide Derivatives     REACTION: rash, nausea    Medications Prior to Admission  Medication Sig Dispense Refill   aspirin EC 81 MG tablet Take 81 mg by mouth daily. Swallow whole.     loratadine (CLARITIN) 10 MG tablet Take 10 mg by mouth daily.     sildenafil (REVATIO) 20 MG tablet Take 40-100 mg by mouth daily as needed (erectile dysfunction).     acetaminophen (TYLENOL) 650 MG CR tablet Take 1,300 mg by mouth every 8 (eight) hours as needed for pain.     fluticasone (FLONASE) 50 MCG/ACT nasal spray 1 spray in each nostril (Patient not taking: Reported on 06/18/2022)     loperamide (IMODIUM) 2 MG capsule Take 2 mg by mouth 4 (four) times daily as needed for diarrhea or loose stools.  methocarbamol (ROBAXIN) 500 MG tablet Take 500 mg by mouth at bedtime as needed for muscle spasms.     traMADol (ULTRAM) 50 MG tablet Take 1-2 tablets (50-100 mg total) by mouth every 6 (six) hours as needed. 20 tablet 0    No results found for this or any previous visit (from the past 48 hours). No results found.  Review of Systems  no recent f/c/n/v/wt loss  Height 6' (1.829 m), weight 99.8 kg. Physical Exam  Wn wd male in nad.  A and O.  EOMI.  Resp unlabored.  L foot with decreased ROM at the hallux MPJ.  Skin healhy.  TTP over the joint dorsally.  Intact sens to LT at the dorsal forefoot.  No lymphadenopathy.  Active PF and DF of the ankle and toes.  Assessment/Plan Left hallux rigidus - to the OR today for hallux MPJ cheilectomy using an MIS approach.  The risks and benefits of the alternative treatment options have been  discussed in detail.  The patient wishes to proceed with surgery and specifically understands risks of bleeding, infection, nerve damage, blood clots, need for additional surgery, amputation and death.   Toni Arthurs, MD 07/11/2023, 7:13 AM

## 2023-07-09 NOTE — Anesthesia Preprocedure Evaluation (Addendum)
Anesthesia Evaluation  Patient identified by MRN, date of birth, ID band Patient awake    Reviewed: Allergy & Precautions, NPO status , Patient's Chart, lab work & pertinent test results  Airway Mallampati: II  TM Distance: >3 FB Neck ROM: Full    Dental  (+) Dental Advisory Given   Pulmonary former smoker   breath sounds clear to auscultation       Cardiovascular negative cardio ROS  Rhythm:Regular Rate:Normal     Neuro/Psych negative neurological ROS     GI/Hepatic Neg liver ROS, PUD,GERD  ,,  Endo/Other  negative endocrine ROS    Renal/GU negative Renal ROS     Musculoskeletal  (+) Arthritis ,    Abdominal   Peds  Hematology negative hematology ROS (+)   Anesthesia Other Findings   Reproductive/Obstetrics                             Anesthesia Physical Anesthesia Plan  ASA: 2  Anesthesia Plan: General   Post-op Pain Management: Regional block* and Tylenol PO (pre-op)*   Induction: Intravenous  PONV Risk Score and Plan: 2 and Dexamethasone, Ondansetron, Midazolam and Treatment may vary due to age or medical condition  Airway Management Planned: LMA  Additional Equipment: None  Intra-op Plan:   Post-operative Plan: Extubation in OR  Informed Consent: I have reviewed the patients History and Physical, chart, labs and discussed the procedure including the risks, benefits and alternatives for the proposed anesthesia with the patient or authorized representative who has indicated his/her understanding and acceptance.     Dental advisory given  Plan Discussed with: CRNA  Anesthesia Plan Comments:        Anesthesia Quick Evaluation

## 2023-07-09 NOTE — Anesthesia Procedure Notes (Signed)
Procedure Name: LMA Insertion Date/Time: 07/09/2023 9:07 AM  Performed by: Cleda Clarks, CRNAPre-anesthesia Checklist: Patient identified, Emergency Drugs available, Suction available and Patient being monitored Patient Re-evaluated:Patient Re-evaluated prior to induction Oxygen Delivery Method: Circle system utilized Preoxygenation: Pre-oxygenation with 100% oxygen Induction Type: IV induction Ventilation: Mask ventilation without difficulty LMA: LMA inserted LMA Size: 5.0 Number of attempts: 1 Placement Confirmation: positive ETCO2 Tube secured with: Tape Dental Injury: Teeth and Oropharynx as per pre-operative assessment

## 2023-07-09 NOTE — Transfer of Care (Signed)
Immediate Anesthesia Transfer of Care Note  Patient: Stephen Carey  Procedure(s) Performed: LEFT HALLUX METATARSALPHALANGEAL JOINT, MINIMALLY INVASIVE CHEILECTOMY (Left: Foot)  Patient Location: PACU  Anesthesia Type:General  Level of Consciousness: awake, alert , and oriented  Airway & Oxygen Therapy: Patient Spontanous Breathing and Patient connected to face mask oxygen  Post-op Assessment: Report given to RN and Post -op Vital signs reviewed and stable  Post vital signs: Reviewed and stable  Last Vitals:  Vitals Value Taken Time  BP 96/48 07/09/23 1001  Temp    Pulse 63 07/09/23 1005  Resp 12 07/09/23 1005  SpO2 99 % 07/09/23 1005  Vitals shown include unfiled device data.  Last Pain:  Vitals:   07/09/23 0738  TempSrc: Temporal  PainSc: 3       Patients Stated Pain Goal: 3 (07/09/23 1610)  Complications: No notable events documented.

## 2023-07-09 NOTE — Progress Notes (Signed)
Pts wife called and said they were told that he would be called in Oxy IR for after surgery  because he couldn't tolerate Aleve. When they called the pharmacy he didn't have a prescription. I text Jill Alexanders, [place] and he stated he would take care of it.

## 2023-07-09 NOTE — Discharge Instructions (Addendum)
No tylenol until 2:45 p.m.   Toni Arthurs, MD [facility]  Please read the following information regarding your care after surgery.  Medications  You only need a prescription for the narcotic pain medicine (ex. oxycodone, Percocet, Norco).  All of the other medicines listed below are available over the counter. ? Aleve 2 pills twice a day for the first 3 days after surgery. ? acetominophen (Tylenol) 650 mg every 4-6 hours as you need for minor to moderate pain  Weight Bearing ? Bear weight only on your operated foot in the post-op shoe.   Cast / Splint / Dressing ? Remove your dressing 3 days after surgery and cover the incisions with dry dressings.  Reapply ACE bandage for compression after bathing once your foot is dry.  After your dressing, cast or splint is removed; you may shower, but do not soak or scrub the wound.  Allow the water to run over it, and then gently pat it dry.  Swelling It is normal for you to have swelling where you had surgery.  To reduce swelling and pain, keep your toes above your nose for at least 3 days after surgery.  It may be necessary to keep your foot or leg elevated for several weeks.  If it hurts, it should be elevated.  Follow Up Call my office at [phone] when you are discharged from the hospital or surgery center to schedule an appointment to be seen two weeks after surgery.  Call my office at [phone] if you develop a fever >101.5 F, nausea, vomiting, bleeding from the surgical site or severe pain.     Post Anesthesia Home Care Instructions  Activity: Get plenty of rest for the remainder of the day. A responsible individual must stay with you for 24 hours following the procedure.  For the next 24 hours, DO NOT: -Drive a car -Advertising copywriter -Drink alcoholic beverages -Take any medication unless instructed by your physician -Make any legal decisions or sign important papers.  Meals: Start with liquid foods such as gelatin or  soup. Progress to regular foods as tolerated. Avoid greasy, spicy, heavy foods. If nausea and/or vomiting occur, drink only clear liquids until the nausea and/or vomiting subsides. Call your physician if vomiting continues.  Special Instructions/Symptoms: Your throat may feel dry or sore from the anesthesia or the breathing tube placed in your throat during surgery. If this causes discomfort, gargle with warm salt water. The discomfort should disappear within 24 hours.  If you had a scopolamine patch placed behind your ear for the management of post- operative nausea and/or vomiting:  1. The medication in the patch is effective for 72 hours, after which it should be removed.  Wrap patch in a tissue and discard in the trash. Wash hands thoroughly with soap and water. 2. You may remove the patch earlier than 72 hours if you experience unpleasant side effects which may include dry mouth, dizziness or visual disturbances. 3. Avoid touching the patch. Wash your hands with soap and water after contact with the patch.   Regional Anesthesia Blocks  1. You may not be able to move or feel the "blocked" extremity after a regional anesthetic block. This may last may last from 3-48 hours after placement, but it will go away. The length of time depends on the medication injected and your individual response to the medication. As the nerves start to wake up, you may experience tingling as the movement and feeling returns to your extremity. If the numbness and  inability to move your extremity has not gone away after 48 hours, please call your surgeon.   2. The extremity that is blocked will need to be protected until the numbness is gone and the strength has returned. Because you cannot feel it, you will need to take extra care to avoid injury. Because it may be weak, you may have difficulty moving it or using it. You may not know what position it is in without looking at it while the block is in effect.  3. For  blocks in the legs and feet, returning to weight bearing and walking needs to be done carefully. You will need to wait until the numbness is entirely gone and the strength has returned. You should be able to move your leg and foot normally before you try and bear weight or walk. You will need someone to be with you when you first try to ensure you do not fall and possibly risk injury.  4. Bruising and tenderness at the needle site are common side effects and will resolve in a few days.  5. Persistent numbness or new problems with movement should be communicated to the surgeon or the [facility] [phone] [facility] [phone]).

## 2023-07-10 ENCOUNTER — Encounter (HOSPITAL_BASED_OUTPATIENT_CLINIC_OR_DEPARTMENT_OTHER): Payer: Self-pay | Admitting: Orthopedic Surgery

## 2023-08-18 DIAGNOSIS — R112 Nausea with vomiting, unspecified: Secondary | ICD-10-CM | POA: Diagnosis not present

## 2023-08-18 DIAGNOSIS — R509 Fever, unspecified: Secondary | ICD-10-CM | POA: Diagnosis not present

## 2023-08-31 DIAGNOSIS — N19 Unspecified kidney failure: Secondary | ICD-10-CM | POA: Diagnosis not present

## 2023-10-05 DIAGNOSIS — E782 Mixed hyperlipidemia: Secondary | ICD-10-CM | POA: Diagnosis not present

## 2023-10-05 DIAGNOSIS — C181 Malignant neoplasm of appendix: Secondary | ICD-10-CM | POA: Diagnosis not present

## 2023-10-26 DIAGNOSIS — R5383 Other fatigue: Secondary | ICD-10-CM | POA: Diagnosis not present

## 2023-10-26 DIAGNOSIS — R0981 Nasal congestion: Secondary | ICD-10-CM | POA: Diagnosis not present

## 2023-10-26 DIAGNOSIS — R051 Acute cough: Secondary | ICD-10-CM | POA: Diagnosis not present

## 2023-10-26 DIAGNOSIS — M2022 Hallux rigidus, left foot: Secondary | ICD-10-CM | POA: Diagnosis not present

## 2023-10-26 DIAGNOSIS — J019 Acute sinusitis, unspecified: Secondary | ICD-10-CM | POA: Diagnosis not present

## 2023-11-23 DIAGNOSIS — G5762 Lesion of plantar nerve, left lower limb: Secondary | ICD-10-CM | POA: Diagnosis not present

## 2023-12-07 DIAGNOSIS — L821 Other seborrheic keratosis: Secondary | ICD-10-CM | POA: Diagnosis not present

## 2023-12-07 DIAGNOSIS — C44719 Basal cell carcinoma of skin of left lower limb, including hip: Secondary | ICD-10-CM | POA: Diagnosis not present

## 2023-12-07 DIAGNOSIS — Z129 Encounter for screening for malignant neoplasm, site unspecified: Secondary | ICD-10-CM | POA: Diagnosis not present

## 2023-12-07 DIAGNOSIS — Z85828 Personal history of other malignant neoplasm of skin: Secondary | ICD-10-CM | POA: Diagnosis not present

## 2023-12-13 DIAGNOSIS — S70361A Insect bite (nonvenomous), right thigh, initial encounter: Secondary | ICD-10-CM | POA: Diagnosis not present

## 2023-12-14 DIAGNOSIS — E119 Type 2 diabetes mellitus without complications: Secondary | ICD-10-CM | POA: Diagnosis not present

## 2023-12-14 DIAGNOSIS — Z961 Presence of intraocular lens: Secondary | ICD-10-CM | POA: Diagnosis not present

## 2023-12-14 DIAGNOSIS — H524 Presbyopia: Secondary | ICD-10-CM | POA: Diagnosis not present

## 2024-01-11 ENCOUNTER — Other Ambulatory Visit: Payer: Medicare HMO

## 2024-01-11 ENCOUNTER — Ambulatory Visit: Payer: Medicare HMO | Admitting: Oncology

## 2024-05-04 DIAGNOSIS — D3610 Benign neoplasm of peripheral nerves and autonomic nervous system, unspecified: Secondary | ICD-10-CM | POA: Diagnosis not present

## 2024-05-19 DIAGNOSIS — M21612 Bunion of left foot: Secondary | ICD-10-CM | POA: Diagnosis not present

## 2024-05-19 DIAGNOSIS — M7742 Metatarsalgia, left foot: Secondary | ICD-10-CM | POA: Diagnosis not present

## 2024-05-19 DIAGNOSIS — M2012 Hallux valgus (acquired), left foot: Secondary | ICD-10-CM | POA: Diagnosis not present

## 2024-05-19 DIAGNOSIS — R262 Difficulty in walking, not elsewhere classified: Secondary | ICD-10-CM | POA: Diagnosis not present

## 2024-05-19 DIAGNOSIS — M79672 Pain in left foot: Secondary | ICD-10-CM | POA: Diagnosis not present

## 2024-05-19 DIAGNOSIS — M2022 Hallux rigidus, left foot: Secondary | ICD-10-CM | POA: Diagnosis not present

## 2024-05-20 ENCOUNTER — Other Ambulatory Visit: Payer: Self-pay | Admitting: *Deleted

## 2024-05-20 DIAGNOSIS — C181 Malignant neoplasm of appendix: Secondary | ICD-10-CM

## 2024-05-20 NOTE — Progress Notes (Signed)
 Patient called and upset that he had missed his appt in June, although he feels never better he would like to get scheduled for follow up with Dr Cloretta.  CT scan orders, labs and OV requested for end of November/early December

## 2024-06-13 DIAGNOSIS — L570 Actinic keratosis: Secondary | ICD-10-CM | POA: Diagnosis not present

## 2024-06-13 DIAGNOSIS — Z129 Encounter for screening for malignant neoplasm, site unspecified: Secondary | ICD-10-CM | POA: Diagnosis not present

## 2024-06-13 DIAGNOSIS — D692 Other nonthrombocytopenic purpura: Secondary | ICD-10-CM | POA: Diagnosis not present

## 2024-06-13 DIAGNOSIS — L820 Inflamed seborrheic keratosis: Secondary | ICD-10-CM | POA: Diagnosis not present

## 2024-06-13 DIAGNOSIS — Z85828 Personal history of other malignant neoplasm of skin: Secondary | ICD-10-CM | POA: Diagnosis not present

## 2024-06-17 ENCOUNTER — Inpatient Hospital Stay: Attending: Oncology

## 2024-06-17 DIAGNOSIS — C181 Malignant neoplasm of appendix: Secondary | ICD-10-CM | POA: Diagnosis not present

## 2024-06-17 LAB — CBC WITH DIFFERENTIAL (CANCER CENTER ONLY)
Abs Immature Granulocytes: 0.02 K/uL (ref 0.00–0.07)
Basophils Absolute: 0 K/uL (ref 0.0–0.1)
Basophils Relative: 0 %
Eosinophils Absolute: 0.1 K/uL (ref 0.0–0.5)
Eosinophils Relative: 2 %
HCT: 45.4 % (ref 39.0–52.0)
Hemoglobin: 15.2 g/dL (ref 13.0–17.0)
Immature Granulocytes: 0 %
Lymphocytes Relative: 28 %
Lymphs Abs: 1.8 K/uL (ref 0.7–4.0)
MCH: 32.6 pg (ref 26.0–34.0)
MCHC: 33.5 g/dL (ref 30.0–36.0)
MCV: 97.4 fL (ref 80.0–100.0)
Monocytes Absolute: 0.6 K/uL (ref 0.1–1.0)
Monocytes Relative: 9 %
Neutro Abs: 4 K/uL (ref 1.7–7.7)
Neutrophils Relative %: 61 %
Platelet Count: 160 K/uL (ref 150–400)
RBC: 4.66 MIL/uL (ref 4.22–5.81)
RDW: 12.5 % (ref 11.5–15.5)
WBC Count: 6.5 K/uL (ref 4.0–10.5)
nRBC: 0 % (ref 0.0–0.2)

## 2024-06-17 LAB — CMP (CANCER CENTER ONLY)
ALT: 42 U/L (ref 0–44)
AST: 30 U/L (ref 15–41)
Albumin: 4.2 g/dL (ref 3.5–5.0)
Alkaline Phosphatase: 78 U/L (ref 38–126)
Anion gap: 9 (ref 5–15)
BUN: 18 mg/dL (ref 8–23)
CO2: 29 mmol/L (ref 22–32)
Calcium: 9.1 mg/dL (ref 8.9–10.3)
Chloride: 102 mmol/L (ref 98–111)
Creatinine: 1.21 mg/dL (ref 0.61–1.24)
GFR, Estimated: >60 mL/min (ref >60)
Glucose, Bld: 97 mg/dL (ref 70–99)
Potassium: 4.3 mmol/L (ref 3.5–5.1)
Sodium: 140 mmol/L (ref 135–145)
Total Bilirubin: 0.7 mg/dL (ref 0.0–1.2)
Total Protein: 7.1 g/dL (ref 6.5–8.1)

## 2024-06-17 LAB — CEA (ACCESS): CEA (CHCC): 1.47 ng/mL (ref 0.00–5.00)

## 2024-06-19 ENCOUNTER — Ambulatory Visit (HOSPITAL_BASED_OUTPATIENT_CLINIC_OR_DEPARTMENT_OTHER)
Admission: RE | Admit: 2024-06-19 | Discharge: 2024-06-19 | Disposition: A | Discharge status: Home / Self Care | Source: Ambulatory Visit | Attending: Oncology | Admitting: Oncology

## 2024-06-19 DIAGNOSIS — C181 Malignant neoplasm of appendix: Secondary | ICD-10-CM | POA: Insufficient documentation

## 2024-06-19 DIAGNOSIS — C189 Malignant neoplasm of colon, unspecified: Secondary | ICD-10-CM | POA: Diagnosis not present

## 2024-06-19 DIAGNOSIS — I700 Atherosclerosis of aorta: Secondary | ICD-10-CM | POA: Diagnosis not present

## 2024-06-19 MED ORDER — IOHEXOL 300 MG/ML  SOLN
100.0000 mL | Freq: Once | INTRAMUSCULAR | Status: AC | PRN
  Administered 2024-06-19: 100 mL via INTRAVENOUS

## 2024-06-22 ENCOUNTER — Telehealth: Payer: Self-pay | Admitting: *Deleted

## 2024-06-22 NOTE — Telephone Encounter (Signed)
 Mrs. Stephen Carey left VM asking why his CT of 11/23 has not been read yet? Called back and spoke to [name] and informed him radiologists will have scan read 5-7 days after scan done unless ordered stat. This RN did call to request to move the read up to higher priority. Confirmed it will be read by his appointment.

## 2024-06-29 ENCOUNTER — Inpatient Hospital Stay

## 2024-06-29 ENCOUNTER — Inpatient Hospital Stay: Admitting: Oncology

## 2024-06-29 ENCOUNTER — Inpatient Hospital Stay: Attending: Oncology

## 2024-06-29 VITALS — BP 116/79 | HR 66 | Temp 98.0°F | Resp 18 | Ht 72.0 in | Wt 229.6 lb

## 2024-06-29 DIAGNOSIS — Z23 Encounter for immunization: Secondary | ICD-10-CM | POA: Diagnosis not present

## 2024-06-29 DIAGNOSIS — C181 Malignant neoplasm of appendix: Secondary | ICD-10-CM | POA: Insufficient documentation

## 2024-06-29 DIAGNOSIS — Z8711 Personal history of peptic ulcer disease: Secondary | ICD-10-CM | POA: Diagnosis not present

## 2024-06-29 DIAGNOSIS — Z8672 Personal history of thrombophlebitis: Secondary | ICD-10-CM | POA: Insufficient documentation

## 2024-06-29 DIAGNOSIS — Z8616 Personal history of COVID-19: Secondary | ICD-10-CM | POA: Insufficient documentation

## 2024-06-29 DIAGNOSIS — N400 Enlarged prostate without lower urinary tract symptoms: Secondary | ICD-10-CM | POA: Diagnosis not present

## 2024-06-29 MED ORDER — INFLUENZA VAC SPLIT HIGH-DOSE 0.5 ML IM SUSY
0.5000 mL | PREFILLED_SYRINGE | Freq: Once | INTRAMUSCULAR | Status: AC
  Administered 2024-06-29: 0.5 mL via INTRAMUSCULAR
  Filled 2024-06-29: qty 0.5

## 2024-06-29 NOTE — Progress Notes (Signed)
  [facility] OFFICE PROGRESS NOTE   Diagnosis: Appendix carcinoma  INTERVAL HISTORY:   Mr. Frappier returns as scheduled.  He feels well.  Good appetite.  No difficulty with bowel function.  He missed a follow-up appointment here in.  Objective:  Vital signs in last 24 hours:  Blood pressure 116/79, pulse 66, temperature 98 F (36.7 C), resp. rate 18, height 6' (1.829 m), weight 229 lb 9.6 oz (104.1 kg), SpO2 98%.    Lymphatics: No cervical, supraclavicular, axillary, or inguinal nodes Resp: Lungs clear bilaterally Cardio: Regular rate and rhythm GI: No hepatosplenomegaly, no mass, nontender Vascular: No leg edema  Lab Results:  Lab Results  Component Value Date   WBC 6.5 06/17/2024   HGB 15.2 06/17/2024   HCT 45.4 06/17/2024   MCV 97.4 06/17/2024   PLT 160 06/17/2024   NEUTROABS 4.0 06/17/2024    CMP  Lab Results  Component Value Date   NA 140 06/17/2024   K 4.3 06/17/2024   CL 102 06/17/2024   CO2 29 06/17/2024   GLUCOSE 97 06/17/2024   BUN 18 06/17/2024   CREATININE 1.21 06/17/2024   CALCIUM  9.1 06/17/2024   PROT 7.1 06/17/2024   ALBUMIN 4.2 06/17/2024   AST 30 06/17/2024   ALT 42 06/17/2024   ALKPHOS 78 06/17/2024   BILITOT 0.7 06/17/2024   GFRNONAA >60 06/17/2024    Lab Results  Component Value Date   CEA1 3.0 08/14/2021   CEA 1.47 06/17/2024   Medications: I have reviewed the patient's current medications.   Assessment/Plan: Goblet cell adenocarcinoma the appendix, stage IIb(pT4a,pN0) Appendectomy 06/26/2021, 2 x 1.2 x 1 cm grade 3 goblet cell adenocarcinoma, acellular mucin invades the visceral peritoneum (pT4a), perineural invasion present, proximal margin involved by invasive carcinoma CT abdomen/pelvis 06/03/2021-newly dilated and mildly thick-walled appendix without significant periappendiceal inflammatory changes, mild sigmoid diverticulosis, evolving postinfectious/inflammatory change in the medial basilar right lower  lobe CT chest 07/15/2021-no evidence of metastatic disease, small nodules at the right lung base-1 appears inflammatory, one 4 mm perifissural nodule Right colectomy 08/14/2021-no residual malignancy, 24 negative lymph nodes Cycle 1 adjuvant Xeloda  09/09/2021 Cycle 2 adjuvant Xeloda  09/30/2021 Cycle 3 adjuvant Xeloda  10/21/2021 Cycle 4 adjuvant Xeloda  11/11/2021 Cycle 5 adjuvant Xeloda  12/02/2021 Cycle 6 adjuvant Xeloda  12/23/2021 Cycle 7 adjuvant Xeloda  01/13/2022 Cycle 8 adjuvant Xeloda  02/03/2022 Colonoscopy 06/12/2022-polyps removed from the ascending and descending colon, tubular adenoma and hyperplastic polyp CTs 06/16/2022-no evidence of recurrent disease, stable 4 mm perifissural right middle lobe nodule CTs 07/06/2023-no evidence of recurrent disease, stable 4 mm right middle lobe perifissural nodule CTs 06/19/2024-no evidence of metastatic disease, right middle lobe nodule appears unchanged by my review  History of peptic ulcer disease BPH Bilateral inguinal hernia repair Left leg superficial phlebitis November 2022 COVID-15 April 2021      Disposition: Stephen Carey is in clinical remission from colon cancer.  He will return for an office visit and CEA in 6 months.  He would like to continue yearly surveillance CT imaging.  He will be due for a colonoscopy in 2026.  Mr. Oguinn received an influenza vaccine today.  Arley Hof, MD  06/29/2024  10:25 AM

## 2024-12-28 ENCOUNTER — Inpatient Hospital Stay: Admitting: Oncology

## 2024-12-28 ENCOUNTER — Inpatient Hospital Stay
